# Patient Record
Sex: Male | Born: 1980 | Race: Black or African American | Hispanic: No | Marital: Married | Smoking: Current every day smoker
Health system: Southern US, Community
[De-identification: ages and names within clinical notes are randomized; demographics above are authoritative.]

## PROBLEM LIST (undated history)

## (undated) DIAGNOSIS — E039 Hypothyroidism, unspecified: Secondary | ICD-10-CM

## (undated) DIAGNOSIS — Z8619 Personal history of other infectious and parasitic diseases: Secondary | ICD-10-CM

## (undated) HISTORY — PX: NO PAST SURGERIES: SHX2092

## (undated) HISTORY — DX: Hypothyroidism, unspecified: E03.9

## (undated) HISTORY — DX: Personal history of other infectious and parasitic diseases: Z86.19

---

## 2008-02-22 ENCOUNTER — Emergency Department (HOSPITAL_BASED_OUTPATIENT_CLINIC_OR_DEPARTMENT_OTHER): Admission: EM | Admit: 2008-02-22 | Discharge: 2008-02-22 | Payer: Self-pay | Admitting: Emergency Medicine

## 2011-05-21 ENCOUNTER — Emergency Department (INDEPENDENT_AMBULATORY_CARE_PROVIDER_SITE_OTHER): Payer: PRIVATE HEALTH INSURANCE

## 2011-05-21 ENCOUNTER — Inpatient Hospital Stay (HOSPITAL_COMMUNITY)
Admission: EM | Admit: 2011-05-21 | Discharge: 2011-05-24 | DRG: 645 | Disposition: A | Discharge status: Home / Self Care | Payer: No Typology Code available for payment source | Source: Ambulatory Visit | Attending: Internal Medicine | Admitting: Internal Medicine

## 2011-05-21 ENCOUNTER — Encounter (HOSPITAL_COMMUNITY): Payer: Self-pay | Admitting: *Deleted

## 2011-05-21 ENCOUNTER — Emergency Department (HOSPITAL_COMMUNITY): Payer: No Typology Code available for payment source

## 2011-05-21 ENCOUNTER — Emergency Department (INDEPENDENT_AMBULATORY_CARE_PROVIDER_SITE_OTHER)
Admission: EM | Admit: 2011-05-21 | Discharge: 2011-05-21 | Disposition: A | Discharge status: Home / Self Care | Payer: PRIVATE HEALTH INSURANCE | Source: Home / Self Care | Attending: Emergency Medicine | Admitting: Emergency Medicine

## 2011-05-21 DIAGNOSIS — R509 Fever, unspecified: Secondary | ICD-10-CM

## 2011-05-21 DIAGNOSIS — E0590 Thyrotoxicosis, unspecified without thyrotoxic crisis or storm: Principal | ICD-10-CM | POA: Diagnosis present

## 2011-05-21 DIAGNOSIS — E860 Dehydration: Secondary | ICD-10-CM | POA: Diagnosis present

## 2011-05-21 DIAGNOSIS — IMO0002 Reserved for concepts with insufficient information to code with codable children: Secondary | ICD-10-CM

## 2011-05-21 DIAGNOSIS — D649 Anemia, unspecified: Secondary | ICD-10-CM | POA: Diagnosis present

## 2011-05-21 DIAGNOSIS — R05 Cough: Secondary | ICD-10-CM | POA: Diagnosis present

## 2011-05-21 DIAGNOSIS — R634 Abnormal weight loss: Secondary | ICD-10-CM

## 2011-05-21 DIAGNOSIS — R197 Diarrhea, unspecified: Secondary | ICD-10-CM | POA: Diagnosis present

## 2011-05-21 DIAGNOSIS — R059 Cough, unspecified: Secondary | ICD-10-CM | POA: Diagnosis present

## 2011-05-21 LAB — COMPREHENSIVE METABOLIC PANEL
ALT: 53 U/L (ref 0–53)
AST: 26 U/L (ref 0–37)
Albumin: 3.3 g/dL — ABNORMAL LOW (ref 3.5–5.2)
Alkaline Phosphatase: 104 U/L (ref 39–117)
BUN: 12 mg/dL (ref 6–23)
CO2: 24 mEq/L (ref 19–32)
Calcium: 12.9 mg/dL — ABNORMAL HIGH (ref 8.4–10.5)
Chloride: 100 mEq/L (ref 96–112)
Creatinine, Ser: 0.72 mg/dL (ref 0.50–1.35)
GFR calc Af Amer: >90 mL/min (ref >90)
GFR calc non Af Amer: >90 mL/min (ref >90)
Glucose, Bld: 94 mg/dL (ref 70–99)
Potassium: 3.8 mEq/L (ref 3.5–5.1)
Sodium: 138 mEq/L (ref 135–145)
Total Bilirubin: 0.4 mg/dL (ref 0.3–1.2)
Total Protein: 6.5 g/dL (ref 6.0–8.3)

## 2011-05-21 LAB — URINALYSIS, ROUTINE W REFLEX MICROSCOPIC
Glucose, UA: NEGATIVE mg/dL
Hgb urine dipstick: NEGATIVE
Ketones, ur: NEGATIVE mg/dL
Leukocytes, UA: NEGATIVE
Protein, ur: NEGATIVE mg/dL
Urobilinogen, UA: 1 mg/dL (ref 0.0–1.0)

## 2011-05-21 LAB — DIFFERENTIAL
Basophils Absolute: 0 10*3/uL (ref 0.0–0.1)
Basophils Relative: 0 % (ref 0–1)
Eosinophils Absolute: 0 10*3/uL (ref 0.0–0.7)
Eosinophils Relative: 0 % (ref 0–5)
Lymphs Abs: 1.9 10*3/uL (ref 0.7–4.0)
Neutrophils Relative %: 71 % (ref 43–77)

## 2011-05-21 LAB — CBC
MCH: 25.4 pg — ABNORMAL LOW (ref 26.0–34.0)
MCHC: 33.5 g/dL (ref 30.0–36.0)
Platelets: 205 10*3/uL (ref 150–400)
RBC: 4.56 MIL/uL (ref 4.22–5.81)
RDW: 13.1 % (ref 11.5–15.5)

## 2011-05-21 LAB — POCT URINALYSIS DIP (DEVICE)
Bilirubin Urine: NEGATIVE
Glucose, UA: NEGATIVE mg/dL
Leukocytes, UA: NEGATIVE
Nitrite: NEGATIVE
Urobilinogen, UA: 0.2 mg/dL (ref 0.0–1.0)

## 2011-05-21 LAB — RAPID URINE DRUG SCREEN, HOSP PERFORMED
Amphetamines: NOT DETECTED
Benzodiazepines: NOT DETECTED
Tetrahydrocannabinol: NOT DETECTED

## 2011-05-21 LAB — LIPASE, BLOOD: Lipase: 18 U/L (ref 11–59)

## 2011-05-21 MED ORDER — IOHEXOL 300 MG/ML  SOLN
100.0000 mL | Freq: Once | INTRAMUSCULAR | Status: AC | PRN
  Administered 2011-05-21: 100 mL via INTRAVENOUS

## 2011-05-21 MED ORDER — IOHEXOL 300 MG/ML  SOLN
20.0000 mL | INTRAMUSCULAR | Status: AC
  Administered 2011-05-21: 20 mL via ORAL

## 2011-05-21 MED ORDER — SODIUM CHLORIDE 0.9 % IV BOLUS (SEPSIS)
1000.0000 mL | Freq: Once | INTRAVENOUS | Status: AC
  Administered 2011-05-21: 1000 mL via INTRAVENOUS

## 2011-05-21 NOTE — ED Provider Notes (Signed)
History     CSN: [id number]  Arrival date & time 05/21/11  1603   First MD Initiated Contact with Patient 05/21/11 1641      Chief Complaint  Patient presents with  . Abdominal Pain    diarrhea    (Consider location/radiation/quality/duration/timing/severity/associated sxs/prior treatment) HPI Patient has emergency department with 2 months worth of diarrhea with about a 60 pound weight loss and also having decreased appetite, nausea, vomiting, and weakness.  Patient states it is not have any chest pain or shortness of breath, back pain, visual changes, headache, cough, or URI symptoms.  Patient states that he has noticed that when he coughed a few times in the last few days, he noted some blood streaks.  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  History reviewed. No pertinent past medical history.  History reviewed. No pertinent past surgical history.  No family history on file.  History  Substance Use Topics  . Smoking status: Current Some Day Smoker  . Smokeless tobacco: Not on file  . Alcohol Use: No      Review of Systems All pertinent positives and negatives reviewed in the history of present illness  Allergies  Review of patient's allergies indicates no known allergies.  Home Medications   Current Outpatient Rx  Name Route Sig Dispense Refill  . VITAMIN C PO Oral Take 1 tablet by mouth daily.     . IBUPROFEN 200 MG PO TABS Oral Take 400 mg by mouth every 6 (six) hours as needed. For pain    . IRON PO Oral Take 1 tablet by mouth daily.     . MULTIVITAMIN PO Oral Take 1 tablet by mouth daily.       BP 148/72  Pulse 106  Temp(Src) 99 F (37.2 C) (Oral)  Resp 24  SpO2 100%  Physical Exam  Constitutional: He is oriented to person, place, and time. He appears well-developed and well-nourished. No distress.  HENT:  Head: Normocephalic and atraumatic.  Mouth/Throat: Oropharynx is clear and  moist. No oropharyngeal exudate.  Eyes: Conjunctivae and EOM are normal. Pupils are equal, round, and reactive to light.  Neck: Normal range of motion. Neck supple.  Cardiovascular: Regular rhythm.  Tachycardia present.  Exam reveals no gallop and no friction rub.   No murmur heard. Pulmonary/Chest: Effort normal and breath sounds normal. No respiratory distress. He has no wheezes. He has no rales.  Abdominal: Soft. Bowel sounds are normal. He exhibits no distension. There is no tenderness. There is no rebound and no guarding.  Lymphadenopathy:    He has cervical adenopathy.  Neurological: He is alert and oriented to person, place, and time.  Skin: Skin is warm and dry. No rash noted. No erythema.    ED Course  Procedures (including critical care time)  Labs Reviewed  COMPREHENSIVE METABOLIC PANEL - Abnormal; Notable for the following:    Calcium 12.9 (*)    Albumin 3.3 (*)    All other components within normal limits  URINALYSIS, ROUTINE W REFLEX MICROSCOPIC - Abnormal; Notable for the following:    APPearance HAZY (*)    All other components within normal limits  LIPASE, BLOOD  URINE RAPID DRUG SCREEN (HOSP PERFORMED)  T4, FREE  TSH  PARATHYROID HORMONE, INTACT (NO CA)  CALCITONIN  HIV-1 DNA QUALITATIVE BY PCR, BLOOD  CORTISOL  VITAMIN D 1,25 DIHYDROXY  VITAMIN D 25 HYDROXY  SEDIMENTATION RATE  PROTEIN ELECTROPHORESIS, SERUM  PTH-RELATED PEPTIDE   Dg Chest 2 View  05/21/2011  *RADIOLOGY REPORT*  Clinical Data: Cough, congestion.  CHEST - 2 VIEW  Comparison: None.  Findings: Heart and mediastinal contours are within normal limits. No focal opacities or effusions.  No acute bony abnormality.  IMPRESSION: No active cardiopulmonary disease.  Original Report Authenticated By: Cyndie Chime, M.D.   Ct Abdomen Pelvis W Contrast  05/21/2011  *RADIOLOGY REPORT*  Clinical Data: Abdominal pain.  Weight loss.  Nausea and vomiting. Diarrhea.  CT ABDOMEN AND PELVIS WITH CONTRAST   Technique:  Multidetector CT imaging of the abdomen and pelvis was performed following the standard protocol during bolus administration of intravenous contrast.  Contrast: OMNIPAQUE IOHEXOL 300 MG/ML  SOLN  Comparison: None.  Findings: Contrast opacity is poor for some reason.  The technologist states that the scan proceeded normally.  Lung bases are clear.  No pleural or pericardial fluid.  The liver has a normal appearance without focal lesions or biliary ductal dilatation. The gallbladder appears collapsed but unremarkable.  The spleen is normal.  The pancreas is normal.  The adrenal glands are normal.  The kidneys are normal.  The aorta and IVC are normal.  No retroperitoneal mass or adenopathy.  No free intraperitoneal fluid or air.  No bowel pathology is seen.  The appendix is normal.  Bladder, prostate gland and seminal vesicles are unremarkable.  No significant bony finding.  IMPRESSION: Poor contrast opacity, reason unknown.  No pathologic finding.  Original Report Authenticated By: Thomasenia Sales, M.D.   Patient was noted to have a high calcium level and with his other symptoms.  Will be admitted to the hospital for further evaluation of this.  I spoke with the [facility] and they will be done to evaluate the patient for admission.   MDM  MDM Reviewed: vitals and nursing note Interpretation: labs, ECG and CT scan Consults: admitting MD       Date: 05/21/2011  Rate: 106  Rhythm: sinus tachycardia  QRS Axis: normal  Intervals: normal  ST/T Wave abnormalities: normal  Conduction Disutrbances:none  Narrative Interpretation:   Old EKG Reviewed: unchanged        Carlyle Dolly, PA-C 05/21/11 2310

## 2011-05-21 NOTE — ED Provider Notes (Signed)
Patient here with his wife and brother. He relates about 2 months ago he started having loss of appetite, diarrhea, with about 60 pound weight loss in that two-month period. He also has some abdominal discomfort. Wife reports he is getting so weak he is having difficulty getting up out of bed or out of a chair. She states as soon as he eats he starts having diarrhea.  Patient is a tall thin male who looks like he feels bad. He has no obvious thyromegaly. He is in no respiratory distress.  Review of patient's labs noted very significant hypercalcemia. Patient started on IV fluids. He is going to be admitted  for treatment of his hypercalcemia. Appropriate further laboratory tests were sent such as thyroid tests and parathyroid hormone.  Medical screening examination/treatment/procedure(s) were conducted as a shared visit with non-physician practitioner(s) and myself.  I personally evaluated the patient during the encounter Devoria Albe, MD, Franz Dell, MD 05/21/11 [phone]

## 2011-05-21 NOTE — ED Notes (Signed)
PT reports diarrhea every other other day and a weight loss of 60lbs per Pt in one month.  PT's PCP is in [place].  PT reports a Poor appetite.  PT was seen at [facility] prior to this visit.

## 2011-05-21 NOTE — ED Notes (Signed)
PA student saw patient prior to nurse.  Pt c/o generalized abd pain intermittent with vomiting for approx 2 mos.  States he's lost 60 lbs in past 2 mos without trying.  Pt's wife states he works 3rd shift, goes to school in the am and then has basketball practice every day.  Not getting a lot of sleep.  States some days he's fine and can eat, but doesn't eat a lot because it may cause him to throw up or start hurting.

## 2011-05-21 NOTE — H&P (Signed)
PCP:  No local. Dr Renard Matter in [facility]   Chief Complaint:  fever  HPI: Greg Harrison is a 31 y.o. male   has no past medical history on file.   Presented with  2 month history of intermittent fever, cough, runny nose some time with bloody mucous, loosing weight 60 lb over 2 months. Have had diarrhea for 2 weeks as well. Reports having a HIV testing done long time ago. He continued to have fevers and came in to Urgent care today from there he was sent to ER. He is fairly healthy male prior to his illness who exercises regularly. He started to take iron supplements to build up his muscle not because he was anemic.    Review of Systems:    Pertinent positives include: night sweats, Fevers, chills, fatigue, weight loss diarrhea, nausea, vomiting, hips and Knee pains, no myalgias  Constitutional:  No weight loss,  HEENT:  No headaches, Difficulty swallowing,Tooth/dental problems,Sore throat,  No sneezing, itching, ear ache, nasal congestion, post nasal drip,  Cardio-vascular:  No chest pain, Orthopnea, PND, anasarca, dizziness, palpitations.no Bilateral lower extremity swelling  GI:  No heartburn, indigestion, abdominal pain,  change in bowel habits, loss of appetite, melena, blood in stool, hematoemesis Resp:  no shortness of breath at rest. No dyspnea on exertion, No excess mucus, no productive cough, No non-productive cough, No coughing up of blood.No change in color of mucus.No wheezing. Skin:  no rash or lesions. No jaundice GU:  no dysuria, change in color of urine, no urgency or frequency. No straining to urinate.  No flank pain.  Musculoskeletal:  No joint pain or no joint swelling. No decreased range of motion. No back pain.  Psych:  No change in mood or affect. No depression or anxiety. No memory loss.  Neuro: no localizing neurological complaints, no tingling, no weakness, no double vision, no gait abnormality, no slurred speech, no confusion  Otherwise ROS are negative  except for above, 10 systems were reviewed  Past Medical History: History reviewed. No pertinent past medical history. History reviewed. No pertinent past surgical history.   Medications: Prior to Admission medications   Medication Sig Start Date End Date Taking? Authorizing Provider  Ascorbic Acid (VITAMIN C PO) Take 1 tablet by mouth daily.    Yes Historical Provider, MD  ibuprofen (ADVIL,MOTRIN) 200 MG tablet Take 400 mg by mouth every 6 (six) hours as needed. For pain   Yes Historical Provider, MD  IRON PO Take 1 tablet by mouth daily.    Yes Historical Provider, MD  Multiple Vitamins-Minerals (MULTIVITAMIN PO) Take 1 tablet by mouth daily.    Yes Historical Provider, MD    Allergies:  No Known Allergies  Social History:  Ambulatory  independently  Lives at  home   reports that he has quit smoking. He does not have any smokeless tobacco history on file. He reports that he does not drink alcohol or use illicit drugs.   Family History: family history includes Heart disease in his father and Hypertension in his other.    Physical Exam: Patient Vitals for the past 24 hrs:  BP Temp Temp src Pulse Resp SpO2  05/21/11 2145 148/72 mmHg 99 F (37.2 C) Oral - 24  100 %  05/21/11 1927 138/74 mmHg 99.7 F (37.6 C) Oral 106  16  98 %  05/21/11 1610 125/67 mmHg 99.3 F (37.4 C) - 113  18  97 %    1. General:  in No Acute distress 2.  Psychological: Alert and Oriented 3. Head/ENT:    Dry Mucous Membranes                          Head Non traumatic, neck supple                          Normal Dentition no ulcerations or plaques present in the mouth 4. SKIN:  decreased Skin turgor,  Skin clean Dry and intact there is numerous lesions which could be consistent with molluscum contagiosum 5. Heart: Regular rate and rhythm no Murmur, Rub or gallop 6. Lungs: Clear to auscultation bilaterally, no wheezes or crackles   7. Abdomen: Soft, non-tender, Non distended 8. Lower extremities: no  clubbing, cyanosis, or edema 9. Neurologically Grossly intact, moving all 4 extremities equally 10. MSK: Normal range of motion  body mass index is unknown because there is no height or weight on file.   Labs on Admission:   [facility] 05/21/11 1646  NA 138  K 3.8  CL 100  CO2 24  GLUCOSE 94  BUN 12  CREATININE 0.72  CALCIUM 12.9*  MG --  PHOS --    Basename 05/21/11 1646  AST 26  ALT 53  ALKPHOS 104  BILITOT 0.4  PROT 6.5  ALBUMIN 3.3*    Basename 05/21/11 1646  LIPASE 18  AMYLASE --    Basename 05/21/11 1439  WBC 10.6*  NEUTROABS 7.5  HGB 11.6*  HCT 34.6*  MCV 75.9*  PLT 205   No results found for this basename: CKTOTAL:3,CKMB:3,CKMBINDEX:3,TROPONINI:3 in the last 72 hours No results found for this basename: TSH,T4TOTAL,FREET3,T3FREE,THYROIDAB in the last 72 hours No results found for this basename: VITAMINB12:2,FOLATE:2,FERRITIN:2,TIBC:2,IRON:2,RETICCTPCT:2 in the last 72 hours No results found for this basename: HGBA1C    CrCl is unknown because there is no height on file for the current visit. ABG No results found for this basename: phart, pco2, po2, hco3, tco2, acidbasedef, o2sat     No results found for this basename: DDIMER     Other results:  I have pearsonaly reviewed this: ECG REPORT  Rate: 106  Rhythm: Sinus tachycardia ST&T Change: Nonspecific changes QTC 425  UA no evidence of infection Sedimentation rate 15  Cultures: No results found for this basename: sdes, specrequest, cult, reptstatus    Pending   Radiological Exams on Admission: Dg Chest 2 View  05/21/2011  *RADIOLOGY REPORT*  Clinical Data: Cough, congestion.  CHEST - 2 VIEW  Comparison: None.  Findings: Heart and mediastinal contours are within normal limits. No focal opacities or effusions.  No acute bony abnormality.  IMPRESSION: No active cardiopulmonary disease.  Original Report Authenticated By: Cyndie Chime, M.D.   Ct Abdomen Pelvis W Contrast  05/21/2011   *RADIOLOGY REPORT*  Clinical Data: Abdominal pain.  Weight loss.  Nausea and vomiting. Diarrhea.  CT ABDOMEN AND PELVIS WITH CONTRAST  Technique:  Multidetector CT imaging of the abdomen and pelvis was performed following the standard protocol during bolus administration of intravenous contrast.  Contrast: OMNIPAQUE IOHEXOL 300 MG/ML  SOLN  Comparison: None.  Findings: Contrast opacity is poor for some reason.  The technologist states that the scan proceeded normally.  Lung bases are clear.  No pleural or pericardial fluid.  The liver has a normal appearance without focal lesions or biliary ductal dilatation. The gallbladder appears collapsed but unremarkable.  The spleen is normal.  The pancreas is normal.  The adrenal  glands are normal.  The kidneys are normal.  The aorta and IVC are normal.  No retroperitoneal mass or adenopathy.  No free intraperitoneal fluid or air.  No bowel pathology is seen.  The appendix is normal.  Bladder, prostate gland and seminal vesicles are unremarkable.  No significant bony finding.  IMPRESSION: Poor contrast opacity, reason unknown.  No pathologic finding.  Original Report Authenticated By: Thomasenia Sales, M.D.    Assessment/Plan  31 year old male presenting with weight loss fevers cough nausea vomiting and hyperglycemia  Present on Admission:  .Hypercalcemia - etiology is not quite clear at this point will admit administer IV fluids aggressively and will follow closely. Will check PTH, PTHrp, TSH vitamin D levels cortisol levels HIV serologies. SPEP and UPEP despite his young age. Negative chest x-ray makes pulmonary disease felt less likely. There is no evidence of adrenal involvement.  .Diarrhea - workup in etiology Will obtain stool cultures fecal lactoferrin  .Abnormal loss of weight and fevers- we'll evaluate for malignancy versus HIV vinfection obtain blood cultures  .Anemia - will check an anemia panel  .Cough - chest x-ray is unremarkable his cough is  nonproductive. I have excised base with e-link and spoke with Dr. Sherene Sires this morning who feels that getting negative chest x-ray he does not require any airborne precautions despite his symptoms. Given that he recently had CT scan with contrast of his abdomen and pelvis will hold off on a CT scan of his chest   Prophylaxis: SCD   CODE STATUS: Full code  I have spent a total of 70 min on this admition  Ithiel Liebler 05/21/2011, 11:24 PM

## 2011-05-21 NOTE — ED Notes (Signed)
pa at bedside. 

## 2011-05-21 NOTE — ED Provider Notes (Signed)
History     CSN: [id number]  Arrival date & time 05/21/11  1323   First MD Initiated Contact with Patient 05/21/11 1340      Chief Complaint  Patient presents with  . Weight Loss  . Abdominal Pain    (Consider location/radiation/quality/duration/timing/severity/associated sxs/prior treatment) Patient is a 31 y.o. male presenting with abdominal pain.  Abdominal Pain The primary symptoms of the illness include abdominal pain, fever, fatigue, nausea, vomiting and diarrhea. The primary symptoms of the illness do not include hematemesis or dysuria. Primary symptoms comment: tactile fever Episode onset: over the past 1-2 months. The onset of the illness was gradual. The problem has been rapidly worsening.  The fever has been unchanged since its onset.  The diarrhea occurs 2 to 4 times per day. Risk factors: denies any new medications, travel.  Associated with: daily, denies recent travel outside the [place] Change in bowel habit: mostly diarrhea, occasional formed stool over the past 2  months  Additional symptoms associated with the illness include chills and diaphoresis. Symptoms associated with the illness do not include heartburn, constipation, urgency or hematuria. Associated medical issues comments: denies PMH.    History reviewed. No pertinent past medical history.  History reviewed. No pertinent past surgical history.  History reviewed. No pertinent family history.  History  Substance Use Topics  . Smoking status: Current Some Day Smoker  . Smokeless tobacco: Not on file  . Alcohol Use: No      Review of Systems  Constitutional: Positive for fever, chills, diaphoresis, activity change, appetite change and fatigue.       Reports progressively "weak, lethargic", unable to eat solid food in approximately 1-2 months  HENT: Negative for neck stiffness.   Respiratory: Positive for cough. Negative for chest tightness.   Cardiovascular: Negative for chest pain, palpitations and leg  swelling.  Gastrointestinal: Positive for nausea, vomiting, abdominal pain and diarrhea. Negative for heartburn, constipation and hematemesis.  Genitourinary: Negative for dysuria, urgency and hematuria.  Musculoskeletal:       Generalized "weakness"  Neurological: Positive for dizziness, weakness, light-headedness and headaches. Negative for numbness.    Allergies  Review of patient's allergies indicates no known allergies.  Home Medications   Current Outpatient Rx  Name Route Sig Dispense Refill  . VITAMIN C PO Oral Take by mouth.    . IBUPROFEN PO Oral Take by mouth.    . IRON PO Oral Take by mouth.    . MULTIVITAMIN PO Oral Take by mouth 1 day or 1 dose.      BP 144/61  Pulse 108  Temp(Src) 100.5 F (38.1 C) (Oral)  Resp 18  SpO2 100%  Physical Exam  Constitutional: He is oriented to person, place, and time. He appears well-developed and well-nourished. No distress.  Cardiovascular: Normal rate.   Pulmonary/Chest: No respiratory distress. He has no wheezes.  Abdominal: He exhibits no distension. There is tenderness. There is no rebound and no guarding.  Musculoskeletal: He exhibits no edema and no tenderness.  Lymphadenopathy:    He has cervical adenopathy.  Neurological: He is alert and oriented to person, place, and time.  Skin: Skin is warm. No rash noted.  Psychiatric: He has a normal mood and affect.       Cooperative     ED Course  Procedures (including critical care time)  Labs Reviewed  GLUCOSE, CAPILLARY - Abnormal; Notable for the following:    Glucose-Capillary 107 (*)    All other components within normal limits  CBC - Abnormal; Notable for the following:    WBC 10.6 (*)    Hemoglobin 11.6 (*)    HCT 34.6 (*)    MCV 75.9 (*)    MCH 25.4 (*)    All other components within normal limits  DIFFERENTIAL - Abnormal; Notable for the following:    Monocytes Absolute 1.2 (*)    All other components within normal limits  POCT URINALYSIS DIP (DEVICE)    Dg Chest 2 View  05/21/2011  *RADIOLOGY REPORT*  Clinical Data: Cough, congestion.  CHEST - 2 VIEW  Comparison: None.  Findings: Heart and mediastinal contours are within normal limits. No focal opacities or effusions.  No acute bony abnormality.  IMPRESSION: No active cardiopulmonary disease.  Original Report Authenticated By: Cyndie Chime, M.D.     1. Fever   2. Weight loss     ver 2 months) ,persistant fever, dizziness, lethargy, constant headache, diarrhea, vomiting  Not improving over the past 2 months, has been able to work, Public librarian * wnl, CBC, CXR done, urinalysis pending, will send to ER by shuttle for further evaluation, patient and wife agreed to tx plan       Lolly Mustache, Student-NP 05/21/11 1553  Derrick Ravel [place], Student-NP 05/21/11 1556

## 2011-05-22 ENCOUNTER — Inpatient Hospital Stay (HOSPITAL_COMMUNITY): Payer: No Typology Code available for payment source

## 2011-05-22 LAB — CBC
MCHC: 33.6 g/dL (ref 30.0–36.0)
RDW: 13.1 % (ref 11.5–15.5)

## 2011-05-22 LAB — CORTISOL: Cortisol, Plasma: 16.6 ug/dL

## 2011-05-22 LAB — FOLATE: Folate: >20 ng/mL

## 2011-05-22 LAB — POCT I-STAT, CHEM 8
HCT: 36 % — ABNORMAL LOW (ref 39.0–52.0)
Hemoglobin: 12.2 g/dL — ABNORMAL LOW (ref 13.0–17.0)
Potassium: 3.8 mEq/L (ref 3.5–5.1)
Sodium: 139 mEq/L (ref 135–145)

## 2011-05-22 LAB — HEPATIC FUNCTION PANEL
Albumin: 3.1 g/dL — ABNORMAL LOW (ref 3.5–5.2)
Alkaline Phosphatase: 95 U/L (ref 39–117)
Total Protein: 5.8 g/dL — ABNORMAL LOW (ref 6.0–8.3)

## 2011-05-22 LAB — COMPREHENSIVE METABOLIC PANEL
ALT: 51 U/L (ref 0–53)
Alkaline Phosphatase: 101 U/L (ref 39–117)
CO2: 27 mEq/L (ref 19–32)
Chloride: 104 mEq/L (ref 96–112)
GFR calc Af Amer: >90 mL/min (ref >90)
GFR calc non Af Amer: >90 mL/min (ref >90)
Glucose, Bld: 96 mg/dL (ref 70–99)
Potassium: 3.7 mEq/L (ref 3.5–5.1)
Sodium: 139 mEq/L (ref 135–145)

## 2011-05-22 LAB — DIFFERENTIAL
Basophils Relative: 0 % (ref 0–1)
Lymphs Abs: 2.4 10*3/uL (ref 0.7–4.0)
Monocytes Absolute: 1.6 10*3/uL — ABNORMAL HIGH (ref 0.1–1.0)
Monocytes Relative: 17 % — ABNORMAL HIGH (ref 3–12)
Neutro Abs: 5.6 10*3/uL (ref 1.7–7.7)

## 2011-05-22 LAB — TSH: TSH: <0.008 u[IU]/mL — ABNORMAL LOW (ref 0.350–4.500)

## 2011-05-22 LAB — PARATHYROID HORMONE, INTACT (NO CA): PTH: <2.5 pg/mL — ABNORMAL LOW (ref 14.0–72.0)

## 2011-05-22 LAB — IRON AND TIBC
Iron: 22 ug/dL — ABNORMAL LOW (ref 42–135)
Saturation Ratios: 8 % — ABNORMAL LOW (ref 20–55)
UIBC: 238 ug/dL (ref 125–400)

## 2011-05-22 LAB — T4, FREE: Free T4: 11.43 ng/dL — ABNORMAL HIGH (ref 0.80–1.80)

## 2011-05-22 LAB — VITAMIN D 25 HYDROXY (VIT D DEFICIENCY, FRACTURES): Vit D, 25-Hydroxy: 33 ng/mL (ref 30–89)

## 2011-05-22 MED ORDER — SODIUM CHLORIDE 0.9 % IV SOLN
INTRAVENOUS | Status: DC
  Administered 2011-05-22 – 2011-05-24 (×5): via INTRAVENOUS

## 2011-05-22 MED ORDER — LOPERAMIDE HCL 2 MG PO CAPS: 4.0000 mg | ORAL_CAPSULE | ORAL | Status: DC | PRN

## 2011-05-22 MED ORDER — ACETAMINOPHEN 325 MG PO TABS: 650.0000 mg | ORAL_TABLET | Freq: Four times a day (QID) | ORAL | Status: DC | PRN

## 2011-05-22 MED ORDER — ONDANSETRON HCL 4 MG/2ML IJ SOLN: 4.0000 mg | Freq: Four times a day (QID) | INTRAMUSCULAR | Status: DC | PRN

## 2011-05-22 MED ORDER — ALBUTEROL SULFATE (5 MG/ML) 0.5% IN NEBU: 2.5000 mg | INHALATION_SOLUTION | RESPIRATORY_TRACT | Status: DC | PRN

## 2011-05-22 MED ORDER — ACETAMINOPHEN 650 MG RE SUPP: 650.0000 mg | Freq: Four times a day (QID) | RECTAL | Status: DC | PRN

## 2011-05-22 MED ORDER — ATENOLOL 25 MG PO TABS
25.0000 mg | ORAL_TABLET | Freq: Every day | ORAL | Status: DC
  Administered 2011-05-22 – 2011-05-23 (×2): 25 mg via ORAL
  Filled 2011-05-22 (×2): qty 1

## 2011-05-22 MED ORDER — SODIUM CHLORIDE 0.9 % IJ SOLN
3.0000 mL | Freq: Two times a day (BID) | INTRAMUSCULAR | Status: DC
  Administered 2011-05-24: 3 mL via INTRAVENOUS

## 2011-05-22 MED ORDER — ONDANSETRON HCL 4 MG PO TABS: 4.0000 mg | ORAL_TABLET | Freq: Four times a day (QID) | ORAL | Status: DC | PRN

## 2011-05-22 MED ORDER — HYDROCODONE-ACETAMINOPHEN 5-325 MG PO TABS: 1.0000 | ORAL_TABLET | ORAL | Status: DC | PRN

## 2011-05-22 MED ORDER — SODIUM CHLORIDE 0.9 % IV SOLN
INTRAVENOUS | Status: AC
  Administered 2011-05-22: 01:00:00 via INTRAVENOUS

## 2011-05-22 MED ORDER — METHIMAZOLE 10 MG PO TABS
10.0000 mg | ORAL_TABLET | Freq: Every day | ORAL | Status: DC
  Administered 2011-05-22 – 2011-05-23 (×2): 10 mg via ORAL
  Filled 2011-05-22 (×2): qty 1

## 2011-05-22 MED ORDER — ALUM & MAG HYDROXIDE-SIMETH 200-200-20 MG/5ML PO SUSP: 30.0000 mL | Freq: Four times a day (QID) | ORAL | Status: DC | PRN

## 2011-05-22 MED ORDER — GUAIFENESIN-DM 100-10 MG/5ML PO SYRP: 5.0000 mL | ORAL_SOLUTION | ORAL | Status: DC | PRN

## 2011-05-22 NOTE — ED Notes (Signed)
Per Dr. Adela Glimpse - hold on transferring pt to floor d/t pt may need Negative pressure room. Dr Adela Glimpse to call this Clinical research associate w/ update shortly. Charge, RN made aware.

## 2011-05-22 NOTE — ED Provider Notes (Signed)
Medical screening examination/treatment/procedure(s) were performed by non-physician practitioner and as supervising physician I was immediately available for consultation/collaboration.Examined and evaluated the patient with NP student  Raynald Blend, MD 05/22/11 [phone]

## 2011-05-22 NOTE — Progress Notes (Signed)
INITIAL ADULT NUTRITION ASSESSMENT Date: 05/22/2011   Time: 11:59 AM  Reason for Assessment: Nutrition Risk Report  ASSESSMENT: Male 31 y.o.  Dx: Hypercalcemia, fever, diarrhea  Hx: History reviewed. No pertinent past medical history.  Related Meds:     . iohexol  20 mL Oral Q1 Hr x 2  . sodium chloride  1,000 mL Intravenous Once  . sodium chloride  3 mL Intravenous Q12H    Ht: 6\' 6"  (198.1 cm)  Wt: 197 lb 4.8 oz (89.495 kg)  Ideal Wt: 97.2 kg % Ideal Wt: 92%  Usual Wt: 245-250 lb % Usual Wt: 78-80%  Body mass index is 22.80 kg/(m^2).  Food/Nutrition Related Hx: unintentional weight loss > 10 lbs within the past month per admission nutrition screen  Labs:  CMP     Component Value Date/Time   NA 139 05/22/2011 0510   K 3.7 05/22/2011 0510   CL 104 05/22/2011 0510   CO2 27 05/22/2011 0510   GLUCOSE 96 05/22/2011 0510   BUN 12 05/22/2011 0510   CREATININE 0.65 05/22/2011 0510   CALCIUM 12.6* 05/22/2011 0510   PROT 5.9* 05/22/2011 0510   ALBUMIN 3.1* 05/22/2011 0510   AST 32 05/22/2011 0510   ALT 51 05/22/2011 0510   ALKPHOS 101 05/22/2011 0510   BILITOT 0.4 05/22/2011 0510   GFRNONAA >90 05/22/2011 0510   GFRAA >90 05/22/2011 0510     Intake/Output Summary (Last 24 hours) at 05/22/11 1200 Last data filed at 05/22/11 1000  Gross per 24 hour  Intake 1677.5 ml  Output      0 ml  Net 1677.5 ml    CBG (last 3)   Basename 05/21/11 1434  GLUCAP 107*    Diet Order: Parke Simmers  Supplements/Tube Feeding: N/A  IVF:    sodium chloride Last Rate: 150 mL/hr at 05/22/11 0113    Estimated Nutritional Needs:   Kcal: 2,200-2,400 Protein: 110-120 gm Fluid: 2.2-2.4 L  RD obtained nutrition hx from pt -- states his appetite is fair; 90% intake with breakfast this AM; reports an approximate 50-60 lb weight loss x 2 months; was having diarrhea and vomiting prior to hospitalization; reports he was consuming his regular meal options but in smaller quantities -- RD interprets this  as pt meeting < 75% of estimated energy requirements; pt meets criteria for severe malnutrition in the context of chronic illness given 20% weight loss < 3 months & suboptimal energy intake for > 1 month; noted evaluation for malignancy vs HIV infection; declining addition of nutrition supplements at this time  NUTRITION DIAGNOSIS: -Predicted suboptimal energy intake (NI-1.6).  Status: Ongoing  RELATED TO: decreased appetite, diarrhea  AS EVIDENCE BY: pt report  MONITORING/EVALUATION(Goals): Goal: meet >90% of estimated nutrition needs to prevent further weight loss Monitor: PO intake, desire for supplements, weight, labs, I/O's  EDUCATION NEEDS: -No education needs identified at this time  INTERVENTION:  No nutrition intervention at this time -- pt declined  RD to follow for nutrition care plan  Dietitian #: [phone]  DOCUMENTATION CODES Per approved criteria  -Severe malnutrition in the context of chronic illness    Alger Memos 05/22/2011, 11:59 AM

## 2011-05-22 NOTE — Progress Notes (Signed)
Utilization Review Completed.Greg Harrison T4/12/2011   

## 2011-05-22 NOTE — Progress Notes (Signed)
Greg Harrison is a 31 y.o. male admitted with 60lb weight loss over 2 month period, diarrhea and easy fatigability. He is hypercalcemic. I have reviewed his chart, seen and examined him at bed side in the presence of his wife. His labs suggest primary hyperthyroidism.  SUBJECTIVE Feels slightly better.   1. Hypercalcemia     History reviewed. No pertinent past medical history. Current Facility-Administered Medications  Medication Dose Route Frequency Provider Last Rate Last Dose  . 0.9 %  sodium chloride infusion   Intravenous Continuous Therisa Doyne, MD 150 mL/hr at 05/22/11 0113    . 0.9 %  sodium chloride infusion   Intravenous Continuous Thorn Demas, MD      . acetaminophen (TYLENOL) tablet 650 mg  650 mg Oral Q6H PRN Therisa Doyne, MD       Or  . acetaminophen (TYLENOL) suppository 650 mg  650 mg Rectal Q6H PRN Therisa Doyne, MD      . albuterol (PROVENTIL) (5 MG/ML) 0.5% nebulizer solution 2.5 mg  2.5 mg Nebulization Q2H PRN Therisa Doyne, MD      . alum & mag hydroxide-simeth (MAALOX/MYLANTA) 200-200-20 MG/5ML suspension 30 mL  30 mL Oral Q6H PRN Therisa Doyne, MD      . atenolol (TENORMIN) tablet 25 mg  25 mg Oral Daily Lillyan Hitson, MD      . guaiFENesin-dextromethorphan (ROBITUSSIN DM) 100-10 MG/5ML syrup 5 mL  5 mL Oral Q4H PRN Therisa Doyne, MD      . HYDROcodone-acetaminophen (NORCO) 5-325 MG per tablet 1-2 tablet  1-2 tablet Oral Q4H PRN Therisa Doyne, MD      . iohexol (OMNIPAQUE) 300 MG/ML solution 100 mL  100 mL Intravenous Once PRN Therisa Doyne, MD   100 mL at 05/21/11 2030  . iohexol (OMNIPAQUE) 300 MG/ML solution 20 mL  20 mL Oral Q1 Hr x 2 Therisa Doyne, MD   20 mL at 05/21/11 1743  . methimazole (TAPAZOLE) tablet 10 mg  10 mg Oral Daily Coen Miyasato, MD      . ondansetron (ZOFRAN) tablet 4 mg  4 mg Oral Q6H PRN Therisa Doyne, MD       Or  . ondansetron (ZOFRAN) injection 4 mg  4 mg Intravenous Q6H PRN Therisa Doyne, MD      . sodium chloride 0.9 % bolus 1,000 mL  1,000 mL Intravenous Once Jamesetta Orleans Lawyer, PA-C   1,000 mL at 05/21/11 1659  . sodium chloride 0.9 % injection 3 mL  3 mL Intravenous Q12H Therisa Doyne, MD       No Known Allergies Principal Problem:  *Hypercalcemia Active Problems:  Diarrhea  Abnormal loss of weight  Anemia  Cough   Vital signs in last 24 hours: Temp:  [98.4 F (36.9 C)-100.5 F (38.1 C)] 98.4 F (36.9 C) (04/11 0400) Pulse Rate:  [102-126] 126  (04/11 0403) Resp:  [16-29] 19  (04/11 0400) BP: (118-154)/(61-85) 118/76 mmHg (04/11 0403) SpO2:  [97 %-100 %] 99 % (04/11 0400) Weight:  [89.495 kg (197 lb 4.8 oz)-97.523 kg (215 lb)] 89.495 kg (197 lb 4.8 oz) (04/11 0400) Weight change:  Last BM Date: 05/22/11  Intake/Output from previous day: 04/10 0701 - 04/11 0700 In: 867.5 [I.V.:867.5] Out: -  Intake/Output this shift: Total I/O In: 810 [P.O.:360; I.V.:450] Out: -   Lab Results:  Basename 05/22/11 0510 05/21/11 1444 05/21/11 1439  WBC 9.7 -- 10.6*  HGB 11.2* 12.2* --  HCT 33.3* 36.0* --  PLT 181 -- 205  BMET  Basename 05/22/11 0510 05/21/11 1646  NA 139 138  K 3.7 3.8  CL 104 100  CO2 27 24  GLUCOSE 96 94  BUN 12 12  CREATININE 0.65 0.72  CALCIUM 12.6* 12.9*    Studies/Results: Dg Chest 2 View  05/21/2011  *RADIOLOGY REPORT*  Clinical Data: Cough, congestion.  CHEST - 2 VIEW  Comparison: None.  Findings: Heart and mediastinal contours are within normal limits. No focal opacities or effusions.  No acute bony abnormality.  IMPRESSION: No active cardiopulmonary disease.  Original Report Authenticated By: Cyndie Chime, M.D.   Ct Abdomen Pelvis W Contrast  05/21/2011  *RADIOLOGY REPORT*  Clinical Data: Abdominal pain.  Weight loss.  Nausea and vomiting. Diarrhea.  CT ABDOMEN AND PELVIS WITH CONTRAST  Technique:  Multidetector CT imaging of the abdomen and pelvis was performed following the standard protocol during bolus  administration of intravenous contrast.  Contrast: OMNIPAQUE IOHEXOL 300 MG/ML  SOLN  Comparison: None.  Findings: Contrast opacity is poor for some reason.  The technologist states that the scan proceeded normally.  Lung bases are clear.  No pleural or pericardial fluid.  The liver has a normal appearance without focal lesions or biliary ductal dilatation. The gallbladder appears collapsed but unremarkable.  The spleen is normal.  The pancreas is normal.  The adrenal glands are normal.  The kidneys are normal.  The aorta and IVC are normal.  No retroperitoneal mass or adenopathy.  No free intraperitoneal fluid or air.  No bowel pathology is seen.  The appendix is normal.  Bladder, prostate gland and seminal vesicles are unremarkable.  No significant bony finding.  IMPRESSION: Poor contrast opacity, reason unknown.  No pathologic finding.  Original Report Authenticated By: Thomasenia Sales, M.D.    Medications: I have reviewed the patient's current medications.   Physical exam GENERAL- alert HEAD- normal atraumatic, no neck masses, normal thyroid, no jvd RESPIRATORY- appears well, vitals normal, no respiratory distress, acyanotic, normal RR, ear and throat exam is normal, neck free of mass or lymphadenopathy, chest clear, no wheezing, crepitations, rhonchi, normal symmetric air entry CVS- regular rate and rhythm, S1, S2 normal, no murmur, click, rub or gallop ABDOMEN- abdomen is soft without significant tenderness, masses, organomegaly or guarding NEURO- Grossly normal EXTREMITIES- extremities normal, atraumatic, no cyanosis or edema  Plan  * Primary hyperthyroidism- ?cause. Fever and some soreness in throat pointing to ?thyroiditis. Will obtain thyroid ultrasound to check for nodules, Thyroid uptake scan, thyroid antibodies. Start atenolol/methimazole. Will need endocrinology follow up outpatient.  *Hypercalcemia- likely due to hyperthyroidism. Continue iVF. * Diarrhea/ Abnormal loss of  weight/ Anemia- related to hyperthyroidism?- monitor. Patient ambulatory. Continue scds.     Kiyani Jernigan 05/22/2011 12:06 PM Pager: [phone].

## 2011-05-23 LAB — COMPREHENSIVE METABOLIC PANEL
ALT: 56 U/L — ABNORMAL HIGH (ref 0–53)
AST: 37 U/L (ref 0–37)
Albumin: 3.3 g/dL — ABNORMAL LOW (ref 3.5–5.2)
CO2: 27 mEq/L (ref 19–32)
Calcium: 13.5 mg/dL (ref 8.4–10.5)
Chloride: 102 mEq/L (ref 96–112)
GFR calc non Af Amer: >90 mL/min (ref >90)
Sodium: 139 mEq/L (ref 135–145)
Total Bilirubin: 0.4 mg/dL (ref 0.3–1.2)

## 2011-05-23 LAB — CBC
Platelets: 214 10*3/uL (ref 150–400)
RBC: 4.88 MIL/uL (ref 4.22–5.81)
WBC: 7 10*3/uL (ref 4.0–10.5)

## 2011-05-23 LAB — FECAL LACTOFERRIN, QUANT

## 2011-05-23 LAB — PROTEIN ELECTROPHORESIS, SERUM
Albumin ELP: 54.3 % — ABNORMAL LOW (ref 55.8–66.1)
Alpha-1-Globulin: 6.1 % — ABNORMAL HIGH (ref 2.9–4.9)
Beta 2: 5.8 % (ref 3.2–6.5)
Beta Globulin: 5.2 % (ref 4.7–7.2)
Gamma Globulin: 13.2 % (ref 11.1–18.8)

## 2011-05-23 LAB — LIPID PANEL
Cholesterol: 89 mg/dL (ref 0–200)
HDL: 66 mg/dL (ref >39)
Total CHOL/HDL Ratio: 1.3 RATIO
VLDL: 6 mg/dL (ref 0–40)

## 2011-05-23 MED ORDER — ATENOLOL 50 MG PO TABS
50.0000 mg | ORAL_TABLET | Freq: Every day | ORAL | Status: DC
Start: 2011-05-24 — End: 2011-05-24
  Administered 2011-05-24: 50 mg via ORAL
  Filled 2011-05-23: qty 1

## 2011-05-23 MED ORDER — FUROSEMIDE 10 MG/ML IJ SOLN
40.0000 mg | Freq: Once | INTRAMUSCULAR | Status: AC
  Administered 2011-05-23: 40 mg via INTRAVENOUS
  Filled 2011-05-23: qty 4

## 2011-05-23 MED ORDER — METHIMAZOLE 10 MG PO TABS
20.0000 mg | ORAL_TABLET | Freq: Two times a day (BID) | ORAL | Status: DC
  Administered 2011-05-23 – 2011-05-24 (×2): 20 mg via ORAL
  Filled 2011-05-23 (×3): qty 2

## 2011-05-23 NOTE — Progress Notes (Signed)
CRITICAL VALUE ALERT  Critical value received:  Calcium: 13.5   Date of notification:  05/23/11      Time of notification:  0815  Critical value read back:yes  Nurse who received alert:  Albina Billet   MD notified (1st page):  Dr. Venetia Constable  Time of first [id number]:[id number]  MD notified (2nd page):  Time of second page:  Responding MD:  Dr. Venetia Constable  Time MD responded: [phone]

## 2011-05-23 NOTE — Progress Notes (Signed)
Discussed with Dr Kumar(endocriology, who is happy to see patient in consult after discharge. He believes severe hyperthyroidism responsible for hypercalcemia- with element of dehydration.). Also discussed with radiology yesterday. Patient ca't get thyroid uptake scan as recent contrast- will need to schedule in 6 weeks. SUBJECTIVE feels better. Less diarrhea.   1. Hypercalcemia     History reviewed. No pertinent past medical history. Current Facility-Administered Medications  Medication Dose Route Frequency Provider Last Rate Last Dose  . 0.9 %  sodium chloride infusion   Intravenous Continuous Reba Hulett, MD 125 mL/hr at 05/23/11 1030    . acetaminophen (TYLENOL) tablet 650 mg  650 mg Oral Q6H PRN Therisa Doyne, MD       Or  . acetaminophen (TYLENOL) suppository 650 mg  650 mg Rectal Q6H PRN Therisa Doyne, MD      . albuterol (PROVENTIL) (5 MG/ML) 0.5% nebulizer solution 2.5 mg  2.5 mg Nebulization Q2H PRN Therisa Doyne, MD      . alum & mag hydroxide-simeth (MAALOX/MYLANTA) 200-200-20 MG/5ML suspension 30 mL  30 mL Oral Q6H PRN Therisa Doyne, MD      . atenolol (TENORMIN) tablet 50 mg  50 mg Oral Daily Earlena Werst, MD      . furosemide (LASIX) injection 40 mg  40 mg Intravenous Once Clearence Vitug, MD   40 mg at 05/23/11 0928  . guaiFENesin-dextromethorphan (ROBITUSSIN DM) 100-10 MG/5ML syrup 5 mL  5 mL Oral Q4H PRN Therisa Doyne, MD      . HYDROcodone-acetaminophen (NORCO) 5-325 MG per tablet 1-2 tablet  1-2 tablet Oral Q4H PRN Therisa Doyne, MD      . loperamide (IMODIUM) capsule 4 mg  4 mg Oral PRN Aslyn Cottman, MD      . methimazole (TAPAZOLE) tablet 20 mg  20 mg Oral BID Jadarian Mckay, MD      . ondansetron (ZOFRAN) tablet 4 mg  4 mg Oral Q6H PRN Therisa Doyne, MD       Or  . ondansetron (ZOFRAN) injection 4 mg  4 mg Intravenous Q6H PRN Anastassia Doutova, MD      . sodium chloride 0.9 % injection 3 mL  3 mL Intravenous Q12H Therisa Doyne, MD      . DISCONTD: atenolol (TENORMIN) tablet 25 mg  25 mg Oral Daily Phenix Vandermeulen, MD   25 mg at 05/23/11 0928  . DISCONTD: methimazole (TAPAZOLE) tablet 10 mg  10 mg Oral Daily Rivers Gassmann, MD   10 mg at 05/23/11 0928   No Known Allergies Principal Problem:  *Hypercalcemia Active Problems:  Diarrhea  Abnormal loss of weight  Anemia  Cough   Vital signs in last 24 hours: Temp:  [98.3 F (36.8 C)-99.6 F (37.6 C)] 98.3 F (36.8 C) (04/12 0500) Pulse Rate:  [88-101] 97  (04/12 0928) Resp:  [20] 20  (04/12 0500) BP: (142-171)/(82-94) 142/82 mmHg (04/12 0928) SpO2:  [97 %-100 %] 97 % (04/12 0500) Weight change:  Last BM Date: 05/22/11  Intake/Output from previous day: 04/11 0701 - 04/12 0700 In: 2325 [P.O.:1200; I.V.:1125] Out: 7 [Urine:4; Stool:3] Intake/Output this shift:    Lab Results:  Basename 05/23/11 0610 05/22/11 0510  WBC 7.0 9.7  HGB 12.3* 11.2*  HCT 36.7* 33.3*  PLT 214 181   BMET  Basename 05/23/11 0610 05/22/11 0510  NA 139 139  K 3.6 3.7  CL 102 104  CO2 27 27  GLUCOSE 101* 96  BUN 12 12  CREATININE 0.66 0.65  CALCIUM 13.5* 12.6*  Studies/Results: Dg Chest 2 View  05/21/2011  *RADIOLOGY REPORT*  Clinical Data: Cough, congestion.  CHEST - 2 VIEW  Comparison: None.  Findings: Heart and mediastinal contours are within normal limits. No focal opacities or effusions.  No acute bony abnormality.  IMPRESSION: No active cardiopulmonary disease.  Original Report Authenticated By: Cyndie Chime, M.D.   US Soft Tissue Head/neck  05/22/2011  *RADIOLOGY REPORT*  Clinical Data: Hyperthyroidism  THYROID ULTRASOUND  Technique: Ultrasound examination of the thyroid gland and adjacent soft tissues was performed.  Comparison:  None  Findings:  Right thyroid lobe:  33 x 36 x 75 mm, inhomogeneous Left thyroid lobe:  29 x 34 x 68 mm Isthmus:  9.3 mm in thickness  Focal nodules:  None  Lymphadenopathy:  None visualized.  IMPRESSION:  Thyromegaly without  discrete nodule or other focal lesion.  Original Report Authenticated By: Osa Craver, M.D.   Ct Abdomen Pelvis W Contrast  05/21/2011  *RADIOLOGY REPORT*  Clinical Data: Abdominal pain.  Weight loss.  Nausea and vomiting. Diarrhea.  CT ABDOMEN AND PELVIS WITH CONTRAST  Technique:  Multidetector CT imaging of the abdomen and pelvis was performed following the standard protocol during bolus administration of intravenous contrast.  Contrast: OMNIPAQUE IOHEXOL 300 MG/ML  SOLN  Comparison: None.  Findings: Contrast opacity is poor for some reason.  The technologist states that the scan proceeded normally.  Lung bases are clear.  No pleural or pericardial fluid.  The liver has a normal appearance without focal lesions or biliary ductal dilatation. The gallbladder appears collapsed but unremarkable.  The spleen is normal.  The pancreas is normal.  The adrenal glands are normal.  The kidneys are normal.  The aorta and IVC are normal.  No retroperitoneal mass or adenopathy.  No free intraperitoneal fluid or air.  No bowel pathology is seen.  The appendix is normal.  Bladder, prostate gland and seminal vesicles are unremarkable.  No significant bony finding.  IMPRESSION: Poor contrast opacity, reason unknown.  No pathologic finding.  Original Report Authenticated By: Thomasenia Sales, M.D.    Medications: I have reviewed the patient's current medications.   Physical exam GENERAL- alert HEAD- normal atraumatic, no neck masses, normal thyroid, no jvd RESPIRATORY- appears well, vitals normal, no respiratory distress, acyanotic, normal RR, ear and throat exam is normal, neck free of mass or lymphadenopathy, chest clear, no wheezing, crepitations, rhonchi, normal symmetric air entry CVS- regular rate and rhythm, S1, S2 normal, no murmur, click, rub or gallop ABDOMEN- abdomen is soft without significant tenderness, masses, organomegaly or guarding NEURO- Grossly normal EXTREMITIES- extremities normal,  atraumatic, no cyanosis or edema  Plan  * Primary hyperthyroidism- ?Graves' disease. Some thyromegaly. Increase methimazole per Dr Lucianne Muss recommendation. Increase atenolol. *Hypercalcemia- likely due to hyperthyroidism/dehydration. Gave lasix dose. Continue iVF.  * Diarrhea/ Abnormal loss of weight/ Anemia- related to hyperthyroidism?- monitor.  Patient ambulatory. Continue scds. If calcium trending down, possible d/c in am.     Cayla Wiegand 05/23/2011 11:36 AM Pager: [phone].

## 2011-05-23 NOTE — ED Provider Notes (Signed)
See prior note   Ward Givens, MD 05/23/11 1727

## 2011-05-24 DIAGNOSIS — E0590 Thyrotoxicosis, unspecified without thyrotoxic crisis or storm: Secondary | ICD-10-CM | POA: Diagnosis present

## 2011-05-24 LAB — COMPREHENSIVE METABOLIC PANEL
ALT: 49 U/L (ref 0–53)
AST: 28 U/L (ref 0–37)
Albumin: 3.2 g/dL — ABNORMAL LOW (ref 3.5–5.2)
CO2: 27 mEq/L (ref 19–32)
Calcium: 13.2 mg/dL (ref 8.4–10.5)
Creatinine, Ser: 0.68 mg/dL (ref 0.50–1.35)
Sodium: 142 mEq/L (ref 135–145)
Total Protein: 6.1 g/dL (ref 6.0–8.3)

## 2011-05-24 LAB — CALCITONIN: Calcitonin: 3 pg/mL

## 2011-05-24 LAB — CBC
MCH: 25.3 pg — ABNORMAL LOW (ref 26.0–34.0)
Platelets: 221 10*3/uL (ref 150–400)
RBC: 4.91 MIL/uL (ref 4.22–5.81)
RDW: 12.7 % (ref 11.5–15.5)

## 2011-05-24 LAB — PHOSPHORUS: Phosphorus: 4.9 mg/dL — ABNORMAL HIGH (ref 2.3–4.6)

## 2011-05-24 LAB — VITAMIN D 1,25 DIHYDROXY: Vitamin D 1, 25 (OH)2 Total: <8 pg/mL — ABNORMAL LOW (ref 18–72)

## 2011-05-24 MED ORDER — SODIUM CHLORIDE 0.9 % IV SOLN
90.0000 mg | Freq: Once | INTRAVENOUS | Status: AC
  Administered 2011-05-24: 90 mg via INTRAVENOUS
  Filled 2011-05-24: qty 10

## 2011-05-24 MED ORDER — ATENOLOL 50 MG PO TABS: 50.0000 mg | ORAL_TABLET | Freq: Every day | ORAL | Status: DC

## 2011-05-24 MED ORDER — MAGNESIUM OXIDE 400 MG PO TABS
800.0000 mg | ORAL_TABLET | Freq: Once | ORAL | Status: AC
  Administered 2011-05-24: 800 mg via ORAL
  Filled 2011-05-24: qty 2

## 2011-05-24 MED ORDER — METHIMAZOLE 20 MG PO TABS: 20.0000 mg | ORAL_TABLET | Freq: Two times a day (BID) | ORAL | Status: DC

## 2011-05-24 NOTE — Discharge Instructions (Signed)
Hypercalcemia Hypercalcemia means the calcium in your blood is too high. A level above 10.5 milligrams per deciliter of blood is considered high. Calcium in our blood is important for the control of many things, such as:  Blood clotting.   Conducting of nerve impulses.   Muscle contraction.   Maintaining teeth and bone health.   Other body functions.  In the bloodstream, calcium maintains a constant balance with another mineral, phosphate. Calcium is absorbed into the body through the small intestine. This is helped by Vitamin D. Calcium levels are maintained mostly by vitamin D and a hormone (parathyroid hormone). But the kidneys also help. Hypercalcemia can happen when the concentration of calcium is too high for the kidneys to maintain balance. The body maintains a balance between the calcium we eat and the calcium already in our body. If calcium intake is increased or we cannot use calcium properly, there may be problems. Some common sources of calcium are:   Dairy products.   Nuts.   Eggs.   Whole grains.   Legumes.   Green leafy vegetables.  CAUSES There are many causes of this condition, but some common ones are:  Hyperparathyroidism. This is an over activity of the parathyroid gland.   Cancers of the breast, kidney, lung, head and neck are common causes of calcium increases.   Medications that cause you to urinate more often (diuretics), nausea, vomiting and diarrhea also increase the calcium in the blood.   Overuse of calcium-containing antacids.  SYMPTOMS  Many patients with mild hypercalcemia have no symptoms. For those with symptoms common problems include:  Loss of appetite.   Constipation.   Increased thirst.   Heart rhythm changes.   Abnormal thinking.   Nausea.   Abdominal pain.   Kidney stones.   Mood swings.   Coma and death when severe.   Vomiting.   Increased urination.   High blood pressure.   Confusion.  DIAGNOSIS   Your  caregiver will do a medical history and perform a physical exam on you.   Calcium and parathyroid hormone (PTH) may be measured with a blood test.  TREATMENT   The treatment depends on the calcium level and what is causing the higher level. Hypercalcemia can be lifethreatening. Fast lowering of the calcium level may be necessary.   With normal kidney function, fluids can be given by vein to clear the excess calcium. Hemodialysis works well to reduce dangerous calcium levels if there is poor kidney function. This is a procedure in which a machine is used to filter out unwanted substances. The blood is then returned to the body.   Drugs, such as diuretics, can be given after adequate fluid intake is established. These medications help the kidneys get rid of extra calcium. Drugs that lessen (inhibit) bone loss are helpful in gaining long-term control. Phosphate pills help lower high calcium levels caused by a low supply of phosphate. Anti-inflammatory agents such as steroids are helpful with some cancers and toxic levels of vitamin D.   Treatment of the underlying cause of the hypercalcemia will also correct the imbalance. Hyperparathyroidism is usually treated by surgical removal of one or more of the parathyroid glands and any tissue, other than the glands themselves, that is producing too much hormone.   The hypercalcemia caused by cancer is difficult to treat without controlling the cancer. Symptoms can be improved with fluids and drug therapy as outlined above.  PROGNOSIS   Surgery to remove the parathyroid glands is usually successful.  This also depends on the amount of damage to the kidneys and whether or not it can be treated.   Mild hypercalcemia can be controlled with good fluid intake and the use of effective medications.   Hypercalcemia often develops as a late complication of cancer. The expected outlook is poor without effective anticancer therapy.  PREVENTION   If you are at risk  for developing hypercalcemia, be familiar with early symptoms. Report these to your caregiver.   Good fluid intake (up to four quarts of liquid a day if possible) is helpful.   Try to control nausea and vomiting, and treat fevers to avoid dehydration.   Lowering the amount of calcium in your diet is not necessary. High blood calcium reduces absorption of calcium in the intestine.   Stay as active as possible.  SEEK IMMEDIATE MEDICAL CARE IF:   You develop chest pain, sweating, or shortness of breath.   You get confused, feel faint or pass out.   You develop severe nausea and vomiting.  MAKE SURE YOU:   Understand these instructions.   Will watch your condition.   Will get help right away if you are not doing well or get worse.  Document Released: 04/12/2004 Document Revised: 01/16/2011 Document Reviewed: 01/22/2010 [facility] Patient Information 2012 [place], [place].Hyperthyroidism The thyroid is a large gland located in the lower front part of your neck. The thyroid helps control metabolism. Metabolism is how your body uses food. It controls metabolism with the hormone thyroxine. When the thyroid is overactive, it produces too much hormone. When this happens, these following problems may occur:   Nervousness   Heat intolerance   Weight loss (in spite of increase food intake)   Diarrhea   Change in hair or skin texture   Palpitations (heart skipping or having extra beats)   Tachycardia (rapid heart rate)   Loss of menstruation (amenorrhea)   Shaking of the hands  CAUSES  Grave's Disease (the immune system attacks the thyroid gland). This is the most common cause.   Inflammation of the thyroid gland.   Tumor (usually benign) in the thyroid gland or elsewhere.   Excessive use of thyroid medications (both prescription and 'natural').   Excessive ingestion of Iodine.  DIAGNOSIS  To prove hyperthyroidism, your caregiver may do blood tests and ultrasound tests.  Sometimes the signs are hidden. It may be necessary for your caregiver to watch this illness with blood tests, either before or after diagnosis and treatment. TREATMENT Short-term treatment There are several treatments to control symptoms. Drugs called beta blockers may give some relief. Drugs that decrease hormone production will provide temporary relief in many people. These measures will usually not give permanent relief. Definitive therapy There are treatments available which can be discussed between you and your caregiver which will permanently treat the problem. These treatments range from surgery (removal of the thyroid), to the use of radioactive iodine (destroys the thyroid by radiation), to the use of antithyroid drugs (interfere with hormone synthesis). The first two treatments are permanent and usually successful. They most often require hormone replacement therapy for life. This is because it is impossible to remove or destroy the exact amount of thyroid required to make a person euthyroid (normal). HOME CARE INSTRUCTIONS  See your caregiver if the problems you are being treated for get worse. Examples of this would be the problems listed above. SEEK MEDICAL CARE IF: Your general condition worsens. MAKE SURE YOU:   Understand these instructions.   Will watch your  condition.   Will get help right away if you are not doing well or get worse.  Document Released: 01/27/2005 Document Revised: 01/16/2011 Document Reviewed: 06/10/2006 [facility] Patient Information 2012 [place], [place].

## 2011-05-24 NOTE — Discharge Summary (Signed)
DISCHARGE SUMMARY  Greg Harrison  Greg#: [id number]  DOB:[birth year]  Date of Admission: 05/21/2011 Date of Discharge: 05/24/2011  Attending Physician:Greg Harrison  Patient's WUJ:WJXBJYN,WGNFAOZH, MD, MD  Consults: Dr Greg Harrison, Greg Harrison(over the phone).  Discharge Diagnoses: Present on Admission:  .Diarrhea .Abnormal loss of weight .Hypercalcemia .Anemia .Cough .Primary hyperthyroidism   Hospital Course: Greg Harrison is a pleasant 31 year old gentleman who came in with a 60lb weight loss over a 2 month period, which was associated with diarrhea and feeling weak. 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 Lab tests showed primary hyperthyroidism, with a tsh<0.001, and free T4 of 11.4. He was also hypercalcemic with calcium of 13.2 at discharge(he received ivf/lasix, and pamidronate). The calcium will need follow up. I expect it to improve with the dose of pamidronate given prior to discharge, and with better control of the hyperthyroidism. An ultrasound of the thyroid showed diffuse thyromegaly, with no obvious nodules. Unfortunately, patient could not receive thyroid uptake scan as he had just received iv contrast for abdominal ct. He will need the scan rescheduled to at least 6 weeks. I am deferring this decision to Dr Greg Harrison, who graciously discussed the case with me over the phone. I have referred Greg Harrison to Dr Greg Harrison. He will call for an appointment. Greg Harrison has improved clinically with interventions in the hospital, and should continue methimazole and atenolol. He has some microcytic anemia, which will need further work up once this acute phase has resolved.   Medication List  As of 05/24/2011  1:37 PM   STOP taking these medications         ibuprofen 200 MG tablet      IRON PO      VITAMIN C PO         TAKE these medications         atenolol 50 MG tablet   Commonly known as: TENORMIN   Take 1 tablet (50 mg total) by mouth daily.     methimazole 20 MG tablet   Commonly known as: TAPAZOLE   Take 1 tablet (20 mg total) by mouth 2 (two) times daily.      MULTIVITAMIN PO   Take 1 tablet by mouth daily.             Day of Discharge BP 141/82  Pulse 86  Temp(Src) 98.3 F (36.8 C) (Oral)  Resp 16  Ht 6\' 6"  (1.981 m)  Wt 89.495 kg (197 lb 4.8 oz)  BMI 22.80 kg/m2  SpO2 95%  Physical Exam: At baseline.  Results for orders placed during the hospital encounter of 05/21/11 (from the past 24 hour(s))  CBC     Status: Abnormal   Collection Time   05/24/11  5:00 AM      Component Value Range   WBC 5.8  4.0 - 10.5 (K/uL)   RBC 4.91  4.22 - 5.81 (MIL/uL)   Hemoglobin 12.4 (*) 13.0 - 17.0 (g/dL)   HCT 08.6 (*) 57.8 - 52.0 (%)   MCV 74.3 (*) 78.0 - 100.0 (fL)   MCH 25.3 (*) 26.0 - 34.0 (pg)   MCHC 34.0  30.0 - 36.0 (g/dL)   RDW 46.9  62.9 - 52.8 (%)   Platelets 221  150 - 400 (K/uL)  COMPREHENSIVE METABOLIC PANEL     Status: Abnormal   Collection Time   05/24/11  5:00 AM      Component Value Range   Sodium 142  135 -  145 (mEq/L)   Potassium 3.6  3.5 - 5.1 (mEq/L)   Chloride 106  96 - 112 (mEq/L)   CO2 27  19 - 32 (mEq/L)   Glucose, Bld 94  70 - 99 (mg/dL)   BUN 14  6 - 23 (mg/dL)   Creatinine, Ser 2.95  0.50 - 1.35 (mg/dL)   Calcium 62.1 (*) 8.4 - 10.5 (mg/dL)   Total Protein 6.1  6.0 - 8.3 (g/dL)   Albumin 3.2 (*) 3.5 - 5.2 (g/dL)   AST 28  0 - 37 (U/L)   ALT 49  0 - 53 (U/L)   Alkaline Phosphatase 113  39 - 117 (U/L)   Total Bilirubin 0.4  0.3 - 1.2 (mg/dL)   GFR calc non Af Amer >90  >90 (mL/min)   GFR calc Af Amer >90  >90 (mL/min)  MAGNESIUM     Status: Abnormal   Collection Time   05/24/11  5:00 AM      Component Value Range   Magnesium 1.4 (*) 1.5 - 2.5 (mg/dL)  PHOSPHORUS     Status: Abnormal   Collection Time   05/24/11  5:00 AM      Component Value Range   Phosphorus 4.9 (*) 2.3 - 4.6 (mg/dL)    Disposition: home today to follow with Dr Greg Harrison in 1-2 weeks.   Follow-up  Appts: Discharge Orders    Future Orders Please Complete By Expires   Diet - low sodium heart healthy      Increase activity slowly         Follow-up Information    Follow up with [facility], [place].   Contact information:   [address] [facility] [place] [address] [phone]          Tests Needing Follow-up: Thyroid uptake scan in 6 weeks.  Time spent in discharge (includes decision making & examination of pt): 25 minutes  Signed: Elizardo Harrison 05/24/2011, 1:37 PM

## 2011-05-24 NOTE — Progress Notes (Signed)
Critical level call from lab. Calcium 13.2 this down from yesterday. MD made aware.

## 2011-05-25 ENCOUNTER — Inpatient Hospital Stay (HOSPITAL_COMMUNITY)
Admission: EM | Admit: 2011-05-25 | Discharge: 2011-05-27 | DRG: 644 | Disposition: A | Discharge status: Home / Self Care | Payer: No Typology Code available for payment source | Source: Ambulatory Visit | Attending: Internal Medicine | Admitting: Internal Medicine

## 2011-05-25 ENCOUNTER — Encounter (HOSPITAL_COMMUNITY): Payer: Self-pay | Admitting: *Deleted

## 2011-05-25 ENCOUNTER — Emergency Department (HOSPITAL_COMMUNITY): Payer: No Typology Code available for payment source

## 2011-05-25 DIAGNOSIS — R509 Fever, unspecified: Secondary | ICD-10-CM | POA: Diagnosis present

## 2011-05-25 DIAGNOSIS — R7401 Elevation of levels of liver transaminase levels: Secondary | ICD-10-CM | POA: Diagnosis not present

## 2011-05-25 DIAGNOSIS — N390 Urinary tract infection, site not specified: Secondary | ICD-10-CM | POA: Diagnosis present

## 2011-05-25 DIAGNOSIS — D509 Iron deficiency anemia, unspecified: Secondary | ICD-10-CM | POA: Diagnosis present

## 2011-05-25 DIAGNOSIS — E0590 Thyrotoxicosis, unspecified without thyrotoxic crisis or storm: Principal | ICD-10-CM | POA: Diagnosis present

## 2011-05-25 DIAGNOSIS — R7402 Elevation of levels of lactic acid dehydrogenase (LDH): Secondary | ICD-10-CM | POA: Diagnosis present

## 2011-05-25 DIAGNOSIS — T382X5A Adverse effect of antithyroid drugs, initial encounter: Secondary | ICD-10-CM | POA: Diagnosis present

## 2011-05-25 LAB — COMPREHENSIVE METABOLIC PANEL
Alkaline Phosphatase: 105 U/L (ref 39–117)
BUN: 14 mg/dL (ref 6–23)
CO2: 26 mEq/L (ref 19–32)
Chloride: 100 mEq/L (ref 96–112)
Creatinine, Ser: 0.82 mg/dL (ref 0.50–1.35)
GFR calc Af Amer: >90 mL/min (ref >90)
GFR calc non Af Amer: >90 mL/min (ref >90)
Glucose, Bld: 95 mg/dL (ref 70–99)
Total Bilirubin: 0.5 mg/dL (ref 0.3–1.2)

## 2011-05-25 LAB — CBC
HCT: 35.5 % — ABNORMAL LOW (ref 39.0–52.0)
HCT: 33.7 % — ABNORMAL LOW (ref 39.0–52.0)
Hemoglobin: 12 g/dL — ABNORMAL LOW (ref 13.0–17.0)
MCH: 25.2 pg — ABNORMAL LOW (ref 26.0–34.0)
MCV: 74.9 fL — ABNORMAL LOW (ref 78.0–100.0)
MCV: 74.4 fL — ABNORMAL LOW (ref 78.0–100.0)
Platelets: 195 10*3/uL (ref 150–400)
RBC: 4.74 MIL/uL (ref 4.22–5.81)
RDW: 13 % (ref 11.5–15.5)
WBC: 10.4 10*3/uL (ref 4.0–10.5)
WBC: 10 10*3/uL (ref 4.0–10.5)

## 2011-05-25 LAB — DIFFERENTIAL
Basophils Absolute: 0 10*3/uL (ref 0.0–0.1)
Eosinophils Relative: 0 % (ref 0–5)
Lymphocytes Relative: 3 % — ABNORMAL LOW (ref 12–46)
Lymphs Abs: 0.3 10*3/uL — ABNORMAL LOW (ref 0.7–4.0)
Monocytes Absolute: 0.4 10*3/uL (ref 0.1–1.0)
Neutro Abs: 9.7 10*3/uL — ABNORMAL HIGH (ref 1.7–7.7)

## 2011-05-25 LAB — URINALYSIS, ROUTINE W REFLEX MICROSCOPIC
Ketones, ur: NEGATIVE mg/dL
Protein, ur: NEGATIVE mg/dL
Urobilinogen, UA: 1 mg/dL (ref 0.0–1.0)

## 2011-05-25 LAB — URINE MICROSCOPIC-ADD ON

## 2011-05-25 LAB — POCT I-STAT, CHEM 8
Calcium, Ion: 1.51 mmol/L — ABNORMAL HIGH (ref 1.12–1.32)
Hemoglobin: 12.6 g/dL — ABNORMAL LOW (ref 13.0–17.0)
Sodium: 135 mEq/L (ref 135–145)
TCO2: 26 mmol/L (ref 0–100)

## 2011-05-25 LAB — CREATININE, SERUM: GFR calc Af Amer: >90 mL/min (ref >90)

## 2011-05-25 MED ORDER — CIPROFLOXACIN IN D5W 400 MG/200ML IV SOLN
400.0000 mg | Freq: Two times a day (BID) | INTRAVENOUS | Status: DC
  Administered 2011-05-25 – 2011-05-26 (×3): 400 mg via INTRAVENOUS
  Filled 2011-05-25 (×4): qty 200

## 2011-05-25 MED ORDER — SODIUM CHLORIDE 0.9 % IV SOLN
INTRAVENOUS | Status: DC
Start: 2011-05-25 — End: 2011-05-25

## 2011-05-25 MED ORDER — SODIUM CHLORIDE 0.9 % IV SOLN: INTRAVENOUS | Status: DC

## 2011-05-25 MED ORDER — HYDROCORTISONE SOD SUCCINATE 100 MG IJ SOLR
50.0000 mg | Freq: Three times a day (TID) | INTRAMUSCULAR | Status: DC
  Administered 2011-05-25 – 2011-05-26 (×3): 50 mg via INTRAVENOUS
  Filled 2011-05-25 (×6): qty 1

## 2011-05-25 MED ORDER — METHYLPREDNISOLONE SODIUM SUCC 125 MG IJ SOLR
125.0000 mg | Freq: Once | INTRAMUSCULAR | Status: AC
  Administered 2011-05-25: 125 mg via INTRAVENOUS
  Filled 2011-05-25: qty 2

## 2011-05-25 MED ORDER — ACETAMINOPHEN 650 MG RE SUPP: 650.0000 mg | Freq: Four times a day (QID) | RECTAL | Status: DC | PRN

## 2011-05-25 MED ORDER — ACETAMINOPHEN 500 MG PO TABS
1000.0000 mg | ORAL_TABLET | Freq: Once | ORAL | Status: AC
  Administered 2011-05-25: 1000 mg via ORAL
  Filled 2011-05-25: qty 2

## 2011-05-25 MED ORDER — ONDANSETRON HCL 4 MG PO TABS: 4.0000 mg | ORAL_TABLET | Freq: Four times a day (QID) | ORAL | Status: DC | PRN

## 2011-05-25 MED ORDER — ENOXAPARIN SODIUM 40 MG/0.4ML ~~LOC~~ SOLN: 40.0000 mg | SUBCUTANEOUS | Status: DC

## 2011-05-25 MED ORDER — IBUPROFEN 800 MG PO TABS
800.0000 mg | ORAL_TABLET | Freq: Once | ORAL | Status: AC
  Administered 2011-05-25: 800 mg via ORAL
  Filled 2011-05-25: qty 1

## 2011-05-25 MED ORDER — SODIUM CHLORIDE 0.9 % IJ SOLN
3.0000 mL | Freq: Two times a day (BID) | INTRAMUSCULAR | Status: DC
  Administered 2011-05-26: 10:00:00 via INTRAVENOUS

## 2011-05-25 MED ORDER — METHIMAZOLE 10 MG PO TABS
20.0000 mg | ORAL_TABLET | Freq: Three times a day (TID) | ORAL | Status: DC
  Administered 2011-05-25 – 2011-05-26 (×4): 20 mg via ORAL
  Filled 2011-05-25 (×7): qty 2

## 2011-05-25 MED ORDER — METRONIDAZOLE IN NACL 5-0.79 MG/ML-% IV SOLN
500.0000 mg | Freq: Three times a day (TID) | INTRAVENOUS | Status: DC
  Administered 2011-05-25 – 2011-05-26 (×3): 500 mg via INTRAVENOUS
  Filled 2011-05-25 (×5): qty 100

## 2011-05-25 MED ORDER — ZOLPIDEM TARTRATE 5 MG PO TABS: 5.0000 mg | ORAL_TABLET | Freq: Every evening | ORAL | Status: DC | PRN

## 2011-05-25 MED ORDER — MORPHINE SULFATE 2 MG/ML IJ SOLN: 2.0000 mg | INTRAMUSCULAR | Status: DC | PRN

## 2011-05-25 MED ORDER — PANTOPRAZOLE SODIUM 40 MG PO TBEC
40.0000 mg | DELAYED_RELEASE_TABLET | Freq: Every day | ORAL | Status: DC
  Administered 2011-05-25 – 2011-05-27 (×3): 40 mg via ORAL
  Filled 2011-05-25 (×3): qty 1

## 2011-05-25 MED ORDER — SODIUM CHLORIDE 0.9 % IV SOLN
INTRAVENOUS | Status: DC
  Administered 2011-05-25 – 2011-05-26 (×2): via INTRAVENOUS

## 2011-05-25 MED ORDER — HYDROCODONE-ACETAMINOPHEN 5-325 MG PO TABS: 1.0000 | ORAL_TABLET | ORAL | Status: DC | PRN

## 2011-05-25 MED ORDER — ONDANSETRON HCL 4 MG/2ML IJ SOLN: 4.0000 mg | Freq: Four times a day (QID) | INTRAMUSCULAR | Status: DC | PRN

## 2011-05-25 MED ORDER — ATENOLOL 25 MG PO TABS
25.0000 mg | ORAL_TABLET | Freq: Two times a day (BID) | ORAL | Status: DC
  Administered 2011-05-25 – 2011-05-27 (×5): 25 mg via ORAL
  Filled 2011-05-25 (×7): qty 1

## 2011-05-25 MED ORDER — ACETAMINOPHEN 325 MG PO TABS: 650.0000 mg | ORAL_TABLET | Freq: Four times a day (QID) | ORAL | Status: DC | PRN

## 2011-05-25 NOTE — Progress Notes (Signed)
05/25/11 0924  Discharge Planning  Type of Residence Private residence  Living Arrangements Spouse/significant other  Home Care Services No  Support Systems Spouse/significant other  Do you have any problems obtaining your medications? No  Once you are discharged, how will you get to your follow-up appointment? Self;Family  Case Management Consult Needed No  Social Work Consult Needed No   Dionne Milo MSW [facility] [place] [facility]. Weekend/Social Worker [phone]

## 2011-05-25 NOTE — ED Provider Notes (Signed)
History     CSN: [id number]  Arrival date & time 05/25/11  0203   First MD Initiated Contact with Patient 05/25/11 0500      Chief Complaint  Patient presents with  . temp nv     (Consider location/radiation/quality/duration/timing/severity/associated sxs/prior treatment) HPI Comments: The patient is a 31 year old male who presents to the hospital with fever nausea vomiting and diarrhea. According to the medical record the patient and his spouse he was recently admitted to the hospital for hyperthyroidism which had resulted in a 60 pound weight loss, tremor, tachycardia and fever. He was treated in the hospital for hypercalcemia and started on atenolol and methimazole. Over the course of the last 24 hours since he was discharged he is had recurrence of his fever up to 102 at home, has had one episode of vomiting persistent diarrhea. His spouse has noticed that he has had a continued tremor and feels like he has ongoing severe symptoms. The ultrasound of his thyroid during the hospital admission showed diffuse thyromegaly.he was unable to have a further thyroid testing to do a CT scan of the abdomen that was performed with contrast. This was supposed to be scheduled as an outpatient.  These are recurrent, moderate to severe, not associated with coughing, shortness of breath, rashes.  Endo :  Dr. Lucianne Muss  The history is provided by the patient, the spouse and medical records.    History reviewed. No pertinent past medical history.  History reviewed. No pertinent past surgical history.  Family History  Problem Relation Age of Onset  . Heart disease Father   . Hypertension Other     History  Substance Use Topics  . Smoking status: Former Games developer  . Smokeless tobacco: Not on file  . Alcohol Use: No      Review of Systems  All other systems reviewed and are negative.    Allergies  Review of patient's allergies indicates no known allergies.  Home Medications   Current  Outpatient Rx  Name Route Sig Dispense Refill  . ATENOLOL 50 MG PO TABS Oral Take 1 tablet (50 mg total) by mouth daily. 30 tablet 0  . METHIMAZOLE 20 MG PO TABS Oral Take 1 tablet (20 mg total) by mouth 2 (two) times daily. 60 tablet 0  . MULTIVITAMIN PO Oral Take 1 tablet by mouth daily.       BP 143/76  Pulse 115  Temp(Src) 103 F (39.4 C) (Oral)  Resp 22  Ht 6.6" (0.168 m)  Wt 215 lb (97.523 kg)  BMI 3,470.18 kg/m2  SpO2 99%  Physical Exam  Nursing note and vitals reviewed. Constitutional: He appears well-developed and well-nourished.       Anxious appearing  HENT:  Head: Normocephalic and atraumatic.  Mouth/Throat: Oropharynx is clear and moist. No oropharyngeal exudate.  Eyes: Conjunctivae and EOM are normal. Pupils are equal, round, and reactive to light. Right eye exhibits no discharge. Left eye exhibits no discharge. No scleral icterus.  Neck: Normal range of motion. Neck supple. No JVD present. No thyromegaly present.  Cardiovascular: Regular rhythm, normal heart sounds and intact distal pulses.  Exam reveals no gallop and no friction rub.   No murmur heard.      Tachycardia to 115  Pulmonary/Chest: Effort normal and breath sounds normal. No respiratory distress. He has no wheezes. He has no rales.  Abdominal: Soft. Bowel sounds are normal. He exhibits no distension and no mass. There is no tenderness.  Musculoskeletal: Normal range of motion. He  exhibits no edema and no tenderness.  Lymphadenopathy:    He has no cervical adenopathy.  Neurological: He is alert. Coordination normal.  Skin: Skin is warm and dry. No rash noted. No erythema.  Psychiatric: He has a normal mood and affect. His behavior is normal.    ED Course  Procedures (including critical care time)  Labs Reviewed  CBC - Abnormal; Notable for the following:    Hemoglobin 12.0 (*)    HCT 35.5 (*)    MCV 74.9 (*)    MCH 25.3 (*)    All other components within normal limits  DIFFERENTIAL -  Abnormal; Notable for the following:    Neutrophils Relative 93 (*)    Neutro Abs 9.7 (*)    Lymphocytes Relative 3 (*)    Lymphs Abs 0.3 (*)    All other components within normal limits  COMPREHENSIVE METABOLIC PANEL - Abnormal; Notable for the following:    Calcium 11.7 (*)    Albumin 3.2 (*)    All other components within normal limits  URINALYSIS, ROUTINE W REFLEX MICROSCOPIC - Abnormal; Notable for the following:    APPearance CLOUDY (*)    Leukocytes, UA TRACE (*)    All other components within normal limits  POCT I-STAT, CHEM 8 - Abnormal; Notable for the following:    Calcium, Ion 1.51 (*)    Hemoglobin 12.6 (*)    HCT 37.0 (*)    All other components within normal limits  URINE MICROSCOPIC-ADD ON - Abnormal; Notable for the following:    Bacteria, UA FEW (*)    All other components within normal limits  CULTURE, BLOOD (ROUTINE X 2)  CULTURE, BLOOD (ROUTINE X 2)   Dg Chest 2 View  05/25/2011  *RADIOLOGY REPORT*  Clinical Data: Fever.  Vomiting.  Diarrhea. Hyperthyroidism.  CHEST - 2 VIEW  Comparison:  05/21/2011  Findings:  The heart size and mediastinal contours are within normal limits.  Both lungs are clear.  The visualized skeletal structures are unremarkable.  IMPRESSION: No active cardiopulmonary disease.  Original Report Authenticated By: Danae Orleans, M.D.     1. Hyperthyroidism   2. Fever       MDM  Has ongoing thyrotoxic appearance, has tachy, fever, but nosources of infx, anticipate need for admission again, fever control, fluids.   Blood cx drawn, labs otherwise normal, CXR neg, UA clear.  Steroids given  Pt admitted to triad for further testing and treatment.  Vida Roller, MD 05/25/11 [phone]

## 2011-05-25 NOTE — ED Notes (Signed)
Report given to floor. Pt transported on cardiac monitor.

## 2011-05-25 NOTE — ED Notes (Signed)
The pt says he just left the hospital for the same symptoms he is having now.  nv and diarrhea with a temp

## 2011-05-25 NOTE — ED Notes (Signed)
Roddrick Sharron (mom) [phone]

## 2011-05-25 NOTE — H&P (Signed)
PCP:   Sheila Oats, MD, MD   Chief Complaint:  Fever, chills, diarrhea.  HPI: Mr Magowan was discharged yesterday under my service, after treatment for newly diagnosed  Thyrotoxicosis. He seemed to have turned the corner, with no fever during the hospital stay, but says he started experiencing fever, vomiting and diarrhea once he got home. He tried to sleep it over, but his wife brought him in early this morning as he was shivering, and was apparently delirious. In ED he had fever of 103, and was tachycardic. CXR was unremarkable, although urine had some bacteria in it. Mr Apuzzo was given solumedrol and IV fluids, and referred for readmission. He feels better now. He says he filled his prescriptions, and took the evening dose of methimazole.  Review of Systems:  Negative except as highlighted in the hpi.  Past Medical History: Hyperthyroidism. History reviewed. No pertinent past surgical history.  Medications: Prior to Admission medications   Medication Sig Start Date End Date Taking? Authorizing Provider  atenolol (TENORMIN) 50 MG tablet Take 1 tablet (50 mg total) by mouth daily. 05/24/11 05/23/12 Yes Frances Joynt, MD  methimazole (TAPAZOLE) 20 MG tablet Take 1 tablet (20 mg total) by mouth 2 (two) times daily. 05/24/11 05/23/12 Yes Briseis Aguilera, MD  Multiple Vitamins-Minerals (MULTIVITAMIN PO) Take 1 tablet by mouth daily.    Yes Historical Provider, MD    Allergies:  No Known Allergies  Social History:  reports that he has quit smoking. He does not have any smokeless tobacco history on file. He reports that he does not drink alcohol or use illicit drugs.  Family History: Family History  Problem Relation Age of Onset  . Heart disease Father   . Hypertension Other     Physical Exam: Filed Vitals:   05/25/11 0450 05/25/11 0456 05/25/11 0648 05/25/11 0743  BP: 143/76   141/64  Pulse:      Temp: 102.1 F (38.9 C)  103 F (39.4 C) 100.3 F (37.9 C)  TempSrc: Oral  Oral  Oral  Resp:    26  Height: 6.6" (0.168 m)     Weight: 97.523 kg (215 lb)     SpO2: 98% 99%  99%   Lying comfortably, not in distress. PERRLA. No jvd. Lungs clear. S1S2 heard. No murmurs. RRR. Abdomen, soft, non tender. +BS. CNS- grossly intact. Extremities- no pedal edema. Peripheral pulses equal.   Labs on Admission:   [facility] 05/25/11 0524 05/25/11 0511 05/24/11 0500  NA 135 135 --  K 3.8 4.0 --  CL 99 100 --  CO2 -- 26 27  GLUCOSE 92 95 --  BUN 14 14 --  CREATININE 1.00 0.82 --  CALCIUM -- 11.7* 13.2*  MG -- -- 1.4*  PHOS -- -- 4.9*    Basename 05/25/11 0511 05/24/11 0500  AST 34 28  ALT 50 49  ALKPHOS 105 113  BILITOT 0.5 0.4  PROT 6.2 6.1  ALBUMIN 3.2* 3.2*   No results found for this basename: LIPASE:2,AMYLASE:2 in the last 72 hours  Basename 05/25/11 0524 05/25/11 0511 05/24/11 0500  WBC -- 10.4 5.8  NEUTROABS -- 9.7* --  HGB 12.6* 12.0* --  HCT 37.0* 35.5* --  MCV -- 74.9* 74.3*  PLT -- 181 221   No results found for this basename: CKTOTAL:3,CKMB:3,CKMBINDEX:3,TROPONINI:3 in the last 72 hours No results found for this basename: TSH,T4TOTAL,FREET3,T3FREE,THYROIDAB in the last 72 hours No results found for this basename: VITAMINB12:2,FOLATE:2,FERRITIN:2,TIBC:2,IRON:2,RETICCTPCT:2 in the last 72 hours  Radiological Exams on Admission: Dg  Chest 2 View  05/25/2011  *RADIOLOGY REPORT*  Clinical Data: Fever.  Vomiting.  Diarrhea. Hyperthyroidism.  CHEST - 2 VIEW  Comparison:  05/21/2011  Findings:  The heart size and mediastinal contours are within normal limits.  Both lungs are clear.  The visualized skeletal structures are unremarkable.  IMPRESSION: No active cardiopulmonary disease.  Original Report Authenticated By: Danae Orleans, M.D.   Dg Chest 2 View  05/21/2011  *RADIOLOGY REPORT*  Clinical Data: Cough, congestion.  CHEST - 2 VIEW  Comparison: None.  Findings: Heart and mediastinal contours are within normal limits. No focal opacities or effusions.  No  acute bony abnormality.  IMPRESSION: No active cardiopulmonary disease.  Original Report Authenticated By: Cyndie Chime, M.D.   US Soft Tissue Head/neck  05/22/2011  *RADIOLOGY REPORT*  Clinical Data: Hyperthyroidism  THYROID ULTRASOUND  Technique: Ultrasound examination of the thyroid gland and adjacent soft tissues was performed.  Comparison:  None  Findings:  Right thyroid lobe:  33 x 36 x 75 mm, inhomogeneous Left thyroid lobe:  29 x 34 x 68 mm Isthmus:  9.3 mm in thickness  Focal nodules:  None  Lymphadenopathy:  None visualized.  IMPRESSION:  Thyromegaly without discrete nodule or other focal lesion.  Original Report Authenticated By: Osa Craver, M.D.   Ct Abdomen Pelvis W Contrast  05/21/2011  *RADIOLOGY REPORT*  Clinical Data: Abdominal pain.  Weight loss.  Nausea and vomiting. Diarrhea.  CT ABDOMEN AND PELVIS WITH CONTRAST  Technique:  Multidetector CT imaging of the abdomen and pelvis was performed following the standard protocol during bolus administration of intravenous contrast.  Contrast: OMNIPAQUE IOHEXOL 300 MG/ML  SOLN  Comparison: None.  Findings: Contrast opacity is poor for some reason.  The technologist states that the scan proceeded normally.  Lung bases are clear.  No pleural or pericardial fluid.  The liver has a normal appearance without focal lesions or biliary ductal dilatation. The gallbladder appears collapsed but unremarkable.  The spleen is normal.  The pancreas is normal.  The adrenal glands are normal.  The kidneys are normal.  The aorta and IVC are normal.  No retroperitoneal mass or adenopathy.  No free intraperitoneal fluid or air.  No bowel pathology is seen.  The appendix is normal.  Bladder, prostate gland and seminal vesicles are unremarkable.  No significant bony finding.  IMPRESSION: Poor contrast opacity, reason unknown.  No pathologic finding.  Original Report Authenticated By: Thomasenia Sales, M.D.    Assessment Thyrotoxicosis in young male  recently diagnosed of hyperthyroidism. Likely, this was athyroid storm, surprising as he seemed to be tolerating interventions well before discharge. Wonder if there was a trigger, like infection. He may have just had a delayed reaction. Plan .Fever- likely due to thyrotoxicosis. Follow septic work up. Will cover for possible uti/gi infection. .Hyperthyroidism- not yet controlled. Will increase methimazole dose, add short course of steroids, continue atenolol, ivf. Monitor in telemetry. .Microcytic anemia- stool hemoccult. Consider gi-?ibd in setting of hyperthyroidism. Marland KitchenUTI (lower urinary tract infection)- will add cipro. Follow UC. Dvt/gi prophylaxis.  Alia Parsley [phone] 05/25/2011, 8:09 AM

## 2011-05-25 NOTE — Progress Notes (Signed)
05/25/11 0925  OTHER  CSW Follow Up Status No follow-up needed   Dionne Milo MSW [facility] Emergency Dept. Weekend/Social Worker [phone]

## 2011-05-25 NOTE — ED Notes (Signed)
Stated was admitted to hosp for hyperthyroidism  and dc home yesterday, fever went up and waited for while , took tylenol and fever did not go , temp 102 at home .

## 2011-05-26 LAB — STOOL CULTURE

## 2011-05-26 LAB — CBC
HCT: 35.6 % — ABNORMAL LOW (ref 39.0–52.0)
Hemoglobin: 12 g/dL — ABNORMAL LOW (ref 13.0–17.0)
MCV: 74.2 fL — ABNORMAL LOW (ref 78.0–100.0)
WBC: 15.7 10*3/uL — ABNORMAL HIGH (ref 4.0–10.5)

## 2011-05-26 LAB — COMPREHENSIVE METABOLIC PANEL
ALT: 136 U/L — ABNORMAL HIGH (ref 0–53)
Albumin: 3.3 g/dL — ABNORMAL LOW (ref 3.5–5.2)
BUN: 22 mg/dL (ref 6–23)
Calcium: 10 mg/dL (ref 8.4–10.5)
GFR calc Af Amer: >90 mL/min (ref >90)
Glucose, Bld: 110 mg/dL — ABNORMAL HIGH (ref 70–99)
Sodium: 142 mEq/L (ref 135–145)
Total Protein: 6.8 g/dL (ref 6.0–8.3)

## 2011-05-26 LAB — URINE CULTURE
Colony Count: NO GROWTH
Culture  Setup Time: 201304142302
Culture: NO GROWTH

## 2011-05-26 LAB — UIFE/LIGHT CHAINS/TP QN, 24-HR UR
Alpha 1, Urine: NOT DETECTED
Free Kappa Lt Chains,Ur: 1.11 mg/dL (ref 0.14–2.42)
Free Lambda Lt Chains,Ur: 0.05 mg/dL (ref 0.02–0.67)
Gamma Globulin, Urine: NOT DETECTED

## 2011-05-26 LAB — HIV-1 RNA, QUALITATIVE, TMA: HIV-1 RNA, Qualitative, TMA: NOT DETECTED

## 2011-05-26 MED ORDER — PROPYLTHIOURACIL 50 MG PO TABS
100.0000 mg | ORAL_TABLET | Freq: Three times a day (TID) | ORAL | Status: DC
  Administered 2011-05-26 – 2011-05-27 (×2): 100 mg via ORAL
  Filled 2011-05-26 (×6): qty 2

## 2011-05-26 MED ORDER — POTASSIUM IODIDE 1 GM/ML PO SOLN
250.0000 mg | Freq: Three times a day (TID) | ORAL | Status: DC
  Filled 2011-05-26 (×4): qty 30

## 2011-05-26 MED ORDER — PREDNISONE 20 MG PO TABS
40.0000 mg | ORAL_TABLET | Freq: Every day | ORAL | Status: DC
  Administered 2011-05-27: 40 mg via ORAL
  Filled 2011-05-26 (×2): qty 2

## 2011-05-26 NOTE — Progress Notes (Signed)
Patient ID: Greg Harrison, male   DOB: [birth year], 31 y.o.   MRN: [id number] Subjective:     CC: Fever and vomiting     1. Hyperthyroidism, new.         HPI:  Hyperthyroidism:  Hyperthyroidism diagnosed in April 2013 when the patient was admitted to the hospital with symptoms of fever and vomiting.  The complaints are reported by the patient are: palpitations which are experienced mostly with activity, shakiness of the body and not of the hands, feeling excessively warm and sweaty, and tiredness. The patient initially had symptoms when he would be playing basketball and he would get excessively hot and sweaty as well as tired. He would also have presents of nausea and periodic diarrhea. The patient thought he was having flulike symptoms and did not have this evaluated. He was continuing to have the symptoms as well as some loose stools daily 2 or 3 times including at night. However he would be having a fairly normal appetite He has lost 60 lbs since these symptoms started. He continued to try need more to gain the weight back but was not able to. He presented to the hospital where he was found to have severe hyperthyroidism as well as hypercalcemia  The current thyroid status is persistent hyperthyroidism .  Studies that the patient has had include a Free T4 of 11 . The last TSH was 0.01 .  The treatment regimen has been methimazole which was started on Friday .  The patient denies heat intolerance , neck swelling , tremor .        ROS:  HEENT:  no headaches.  CARDIOLOGY:  no High blood pressure.  RESPIRATORY:  no DOE (dyspnea on exertion).  GASTROENTEROLOGY:  no he has had episodes of vomiting and also fairly regular symptoms of mild diarrhea for the last 2 months  ENDOCRINOLOGY:  no Diabetes.         Family History:  Negative for thyroid disease.       Social History: Nonsmoker         Medications: As listed on  chart        Allergies: None       Objective:     Examination:  Problems focused exam:  General Appearance: pleasant, not anxious or hyperkinetic. Currently he is afebrile. Pulse 84 regular  Eyes: No proptosis or eyelid swelling. No lid lag or stare .  Neck: The thyroid is enlarged 3 times normal, smooth, non-tender and diffuse. This is mostly laterally and there is no palpable thrill or bruit There is no lymphadenopathy .  Heart: normal S1 and S2, no murmurs .  Lungs: breath sounds are clear bilaterally .   Abdomen: Hepatosplenomegaly, distention or tenderness  Extremities: hands are not unusually warm or moist No ankle edema .  Neurological:  REFLEXES: at biceps and ankles are hyperactive 2 no +.  TREMORS: No tremors are present..      ASSESSMENT/PLAN: The patient clearly has severe hyperthyroidism but not convinced that he has had a thyroid storm. He does have some atypical presentations with fever and episodes of vomiting but did not have any mental status changes or extreme tachycardia to indicate thyroid storm. However he has responded to steroids. Liver function abnormalities: Although these are acute this is unlikely that he would have these with only 2 or 3 doses of methimazole. Increased liver functions may be related to his acute febrile illness along with hyperthyroidism. However will change his treatment to Propyl-Thyracil 100  mg 3 times a day and continue to monitor. 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 We'll also add steroids for a week which will also hopefully prevent the rise in his calcium level for febrile episodes. His heart rate should be controlled with beta blockers. Patient will be seen in followup in about a week. Discussed that long-term he will need I-131 treatment but this probably can be done for at  least 2 months because of his iodine exposure

## 2011-05-26 NOTE — Progress Notes (Signed)
Utilization Review Completed.Greg Harrison T4/15/2013   

## 2011-05-26 NOTE — Progress Notes (Signed)
Have spoken with Dr Lucianne Muss who will graciously see patient in consult later today- patient seems to be reacting adversely to Methimazole/transaminitis/fever?). I have discontinued methimazole till dr Lucianne Muss sees him, in case he is reacting adversely. Surprisingly, Mr Wyne did not have fever during his hospital stay last week and developed fever of 103F once he got home. Could this be the drug reaction or thyrotoxicosis, which seemed under control. He dose not seem to have infection. He feels ok today but he has some hiccups.    1. Hyperthyroidism   2. Fever     History reviewed. No pertinent past medical history. Current Facility-Administered Medications  Medication Dose Route Frequency Provider Last Rate Last Dose  . 0.9 %  sodium chloride infusion   Intravenous Continuous Lathyn Griggs, MD 100 mL/hr at 05/26/11 0823    . atenolol (TENORMIN) tablet 25 mg  25 mg Oral BID Doug Bucklin, MD   25 mg at 05/26/11 0948  . HYDROcodone-acetaminophen (NORCO) 5-325 MG per tablet 1-2 tablet  1-2 tablet Oral Q4H PRN Wrenley Sayed, MD      . morphine 2 MG/ML injection 2 mg  2 mg Intravenous Q4H PRN Truitt Cruey, MD      . ondansetron (ZOFRAN) tablet 4 mg  4 mg Oral Q6H PRN Rossi Burdo, MD       Or  . ondansetron (ZOFRAN) injection 4 mg  4 mg Intravenous Q6H PRN Criag Wicklund, MD      . pantoprazole (PROTONIX) EC tablet 40 mg  40 mg Oral Q1200 Arpi Diebold, MD   40 mg at 05/26/11 1152  . predniSONE (DELTASONE) tablet 40 mg  40 mg Oral Q breakfast Edilson Vital, MD      . sodium chloride 0.9 % injection 3 mL  3 mL Intravenous Q12H Gohan Collister, MD      . zolpidem (AMBIEN) tablet 5 mg  5 mg Oral QHS PRN Keairra Bardon, MD      . DISCONTD: acetaminophen (TYLENOL) suppository 650 mg  650 mg Rectal Q6H PRN Ghazi Rumpf, MD      . DISCONTD: acetaminophen (TYLENOL) tablet 650 mg  650 mg Oral Q6H PRN Hermenia Fritcher, MD      . DISCONTD: ciprofloxacin (CIPRO) IVPB 400 mg  400 mg Intravenous Q12H Kiki Bivens, MD    400 mg at 05/26/11 0947  . DISCONTD: hydrocortisone sodium succinate (SOLU-CORTEF) 100 mg/2 mL injection 50 mg  50 mg Intravenous Q8H Eretria Manternach, MD   50 mg at 05/26/11 0824  . DISCONTD: methimazole (TAPAZOLE) tablet 20 mg  20 mg Oral TID Conley Canal, MD   20 mg at 05/26/11 0948  . DISCONTD: metroNIDAZOLE (FLAGYL) IVPB 500 mg  500 mg Intravenous Q8H Pinky Ravan, MD   500 mg at 05/26/11 0948   No Known Allergies Active Problems:  Fever  Hyperthyroidism  Microcytic anemia  UTI (lower urinary tract infection)   Vital signs in last 24 hours: Temp:  [98.5 F (36.9 C)-99.3 F (37.4 C)] 98.5 F (36.9 C) (04/15 1400) Pulse Rate:  [73-89] 86  (04/15 1400) Resp:  [18] 18  (04/15 1400) BP: (124-148)/(63-82) 128/65 mmHg (04/15 1400) SpO2:  [98 %-100 %] 98 % (04/15 1400) Weight change:  Last BM Date: 05/26/11  Intake/Output from previous day: 04/14 0701 - 04/15 0700 In: 240 [P.O.:240] Out: -  Intake/Output this shift:    Lab Results:  Basename 05/26/11 1051 05/25/11 0935  WBC 15.7* 10.0  HGB 12.0* 11.4*  HCT 35.6* 33.7*  PLT 196  195   BMET  Basename 05/26/11 0958 05/25/11 0935 05/25/11 0524 05/25/11 0511  NA 142 -- 135 --  K 3.5 -- 3.8 --  CL 104 -- 99 --  CO2 25 -- -- 26  GLUCOSE 110* -- 92 --  BUN 22 -- 14 --  CREATININE 0.68 0.79 -- --  CALCIUM 10.0 -- -- 11.7*    Studies/Results: Dg Chest 2 View  05/25/2011  *RADIOLOGY REPORT*  Clinical Data: Fever.  Vomiting.  Diarrhea. Hyperthyroidism.  CHEST - 2 VIEW  Comparison:  05/21/2011  Findings:  The heart size and mediastinal contours are within normal limits.  Both lungs are clear.  The visualized skeletal structures are unremarkable.  IMPRESSION: No active cardiopulmonary disease.  Original Report Authenticated By: Danae Orleans, M.D.    Medications: I have reviewed the patient's current medications.   Physical exam GENERAL- alert HEAD- normal atraumatic, no neck masses, normal thyroid, no jvd RESPIRATORY-  appears well, vitals normal, no respiratory distress, acyanotic, normal RR, ear and throat exam is normal, neck free of mass or lymphadenopathy, chest clear, no wheezing, crepitations, rhonchi, normal symmetric air entry CVS- regular rate and rhythm, S1, S2 normal, no murmur, click, rub or gallop ABDOMEN- abdomen is soft without significant tenderness, masses, organomegaly or guarding NEURO- Grossly normal EXTREMITIES- extremities normal, atraumatic, no cyanosis or edema  Plan .Fever- Maybe a drug reaction. No strong evidence for infection. Monitor off abx, off methimazole. Marland KitchenHyperthyroidism- concern for adverse reaction to methimazole. Continue short course of steroids, continue atenolol, ivf. Monitor in telemetry. Dr Lucianne Muss will see patient later today. .Microcytic anemia- stool hemoccult. Will consult Dr Loreta Ave. Transaminitis- ?cause(seems meds related. Obtain ultrasound abdomen. D/c flgyl/cipro/methimazole. Leukocytosis- seems steroid induced. Monitor. Follow   Dvt/gi prophylaxis.         Kristoph Sattler 05/26/2011 4:08 PM Pager: [phone].

## 2011-05-27 ENCOUNTER — Inpatient Hospital Stay (HOSPITAL_COMMUNITY): Payer: No Typology Code available for payment source

## 2011-05-27 LAB — HEPATIC FUNCTION PANEL
ALT: 94 U/L — ABNORMAL HIGH (ref 0–53)
AST: 53 U/L — ABNORMAL HIGH (ref 0–37)
Albumin: 2.7 g/dL — ABNORMAL LOW (ref 3.5–5.2)
Alkaline Phosphatase: 89 U/L (ref 39–117)
Total Bilirubin: 0.3 mg/dL (ref 0.3–1.2)
Total Protein: 5.6 g/dL — ABNORMAL LOW (ref 6.0–8.3)

## 2011-05-27 LAB — PROTIME-INR: Prothrombin Time: 16.9 seconds — ABNORMAL HIGH (ref 11.6–15.2)

## 2011-05-27 LAB — CBC
Hemoglobin: 10.2 g/dL — ABNORMAL LOW (ref 13.0–17.0)
MCH: 24.8 pg — ABNORMAL LOW (ref 26.0–34.0)
MCV: 74.2 fL — ABNORMAL LOW (ref 78.0–100.0)
RBC: 4.11 MIL/uL — ABNORMAL LOW (ref 4.22–5.81)

## 2011-05-27 LAB — BASIC METABOLIC PANEL
BUN: 14 mg/dL (ref 6–23)
CO2: 24 mEq/L (ref 19–32)
Calcium: 8.4 mg/dL (ref 8.4–10.5)
Creatinine, Ser: 0.75 mg/dL (ref 0.50–1.35)
Glucose, Bld: 86 mg/dL (ref 70–99)

## 2011-05-27 MED ORDER — PROPYLTHIOURACIL 50 MG PO TABS: 100.0000 mg | ORAL_TABLET | Freq: Three times a day (TID) | ORAL | Status: DC

## 2011-05-27 MED ORDER — POTASSIUM CHLORIDE 20 MEQ/15ML (10%) PO LIQD
40.0000 meq | Freq: Once | ORAL | Status: AC
  Administered 2011-05-27: 40 meq via ORAL
  Filled 2011-05-27: qty 30

## 2011-05-27 MED ORDER — PREDNISONE 10 MG PO TABS: ORAL_TABLET | ORAL | Status: DC

## 2011-05-27 MED ORDER — IODINE STRONG (LUGOLS) 5 % PO SOLN
0.2000 mL | Freq: Three times a day (TID) | ORAL | Status: DC
  Administered 2011-05-27: 1 mL via ORAL
  Filled 2011-05-27 (×3): qty 0.2

## 2011-05-27 MED ORDER — IODINE STRONG (LUGOLS) 5 % PO SOLN: 0.2000 mL | Freq: Three times a day (TID) | ORAL | Status: DC

## 2011-05-27 NOTE — Discharge Summary (Signed)
DISCHARGE SUMMARY  Greg Harrison  Greg#: [id number]  DOB:[birth year]  Date of Admission: 05/25/2011 Date of Discharge: 05/27/2011  Attending Physician:Madlyn Crosby  Patient's WUJ:WJXBJYN,WGNFAOZH, MD, MD  Consults: -Dr Reather Littler  Discharge Diagnoses: Present on Admission:  .Fever .Hyperthyroidism .Microcytic anemia .UTI (lower urinary tract infection)   Hospital Course: Greg Harrison was readmitted with fever, and chills a day after discharge after management for hyperthyroidism. He likely had an adverse reaction to methimazole. He developed transaminitis, which seems to be resolving after methimazole was discontinued. Dr Lucianne Muss graciously helped with patient's management. He has placed Greg Harrison on PTU, and Lugol's solution, as well as a week of steroids. Greg Harrison feels better. Greg Harrison will follow with Dr Lucianne Muss in 1 week. He is discharged in 1 week. He needs a follow up of his LFTs, and a thyroid uptake scan in 6 weeks. Of note is that his thyroid peroxidase antibody was elevated at 391. His fever does not seem to be infection related.   Medication List  As of 05/27/2011  2:23 PM   STOP taking these medications         methimazole 20 MG tablet         TAKE these medications         atenolol 50 MG tablet   Commonly known as: TENORMIN   Take 1 tablet (50 mg total) by mouth daily.      Iodine Strong (Lugols) 5 % Soln solution   Take 0.2 mLs by mouth 3 (three) times daily.      MULTIVITAMIN PO   Take 1 tablet by mouth daily.      predniSONE 10 MG tablet   Commonly known as: DELTASONE   Take 4 tablets daily for 2 days, then 3 tablets daily for 2 days, then 2 tablets daily for 2 days, then 1 tablet daily for 2 days.      propylthiouracil 50 MG tablet   Commonly known as: PTU   Take 2 tablets (100 mg total) by mouth every 8 (eight) hours.             Day of Discharge BP 141/75  Pulse 88  Temp(Src) 100.9 F (38.3 C) (Oral)  Resp 18  Ht 6\' 6"  (1.981 m)  Wt 97.523 kg  (215 lb)  BMI 24.85 kg/m2  SpO2 98%  Physical Exam: At baseline.  Results for orders placed during the hospital encounter of 05/25/11 (from the past 24 hour(s))  CBC     Status: Abnormal   Collection Time   05/27/11  6:03 AM      Component Value Range   WBC 8.7  4.0 - 10.5 (K/uL)   RBC 4.11 (*) 4.22 - 5.81 (MIL/uL)   Hemoglobin 10.2 (*) 13.0 - 17.0 (g/dL)   HCT 08.6 (*) 57.8 - 52.0 (%)   MCV 74.2 (*) 78.0 - 100.0 (fL)   MCH 24.8 (*) 26.0 - 34.0 (pg)   MCHC 33.4  30.0 - 36.0 (g/dL)   RDW 46.9  62.9 - 52.8 (%)   Platelets 228  150 - 400 (K/uL)  BASIC METABOLIC PANEL     Status: Abnormal   Collection Time   05/27/11  6:03 AM      Component Value Range   Sodium 138  135 - 145 (mEq/L)   Potassium 3.3 (*) 3.5 - 5.1 (mEq/L)   Chloride 104  96 - 112 (mEq/L)   CO2 24  19 - 32 (mEq/L)   Glucose, Bld 86  70 -  99 (mg/dL)   BUN 14  6 - 23 (mg/dL)   Creatinine, Ser 1.61  0.50 - 1.35 (mg/dL)   Calcium 8.4  8.4 - 09.6 (mg/dL)   GFR calc non Af Amer >90  >90 (mL/min)   GFR calc Af Amer >90  >90 (mL/min)  MAGNESIUM     Status: Normal   Collection Time   05/27/11  6:03 AM      Component Value Range   Magnesium 1.9  1.5 - 2.5 (mg/dL)  PROTIME-INR     Status: Abnormal   Collection Time   05/27/11  6:03 AM      Component Value Range   Prothrombin Time 16.9 (*) 11.6 - 15.2 (seconds)   INR 1.35  0.00 - 1.49   HEPATIC FUNCTION PANEL     Status: Abnormal   Collection Time   05/27/11  6:03 AM      Component Value Range   Total Protein 5.6 (*) 6.0 - 8.3 (g/dL)   Albumin 2.7 (*) 3.5 - 5.2 (g/dL)   AST 53 (*) 0 - 37 (U/L)   ALT 94 (*) 0 - 53 (U/L)   Alkaline Phosphatase 89  39 - 117 (U/L)   Total Bilirubin 0.3  0.3 - 1.2 (mg/dL)   Bilirubin, Direct 0.1  0.0 - 0.3 (mg/dL)   Indirect Bilirubin 0.2 (*) 0.3 - 0.9 (mg/dL)    Disposition: home today.   Follow-up Appts: Discharge Orders    Future Orders Please Complete By Expires   Diet - low sodium heart healthy      Increase activity  slowly         Follow-up Information    Follow up with [facility], MD in 1 week.   Contact information:   [address] [facility] [place] [address] [phone]          Tests Needing Follow-up: LFTs, thyroid uptake scan.  Time spent in discharge (includes decision making & examination of pt): 15 minutes  Signed: Kaivon Livesey 05/27/2011, 2:23 PM

## 2011-05-27 NOTE — Progress Notes (Signed)
Pt given all discharge information and RX ,FU care pt understanding all orders

## 2011-05-27 NOTE — Progress Notes (Signed)
Subjective: Fever  Objective: Vital signs in last 24 hours: Temp:  [98.5 F (36.9 C)-100.9 F (38.3 C)] 100.9 F (38.3 C) (04/16 0500) Pulse Rate:  [86-94] 88  (04/16 0500) Resp:  [18] 18  (04/16 0500) BP: (128-141)/(61-75) 141/75 mmHg (04/16 0500) SpO2:  [98 %-100 %] 98 % (04/16 0500) Weight change:  Last BM Date: 05/26/11  Intake/Output from previous day:   Intake/Output this shift:    General appearance: alert and no distress No tremor Heart rate 88  Lab Results:  Basename 05/26/11 1051 05/25/11 0935  WBC 15.7* 10.0  HGB 12.0* 11.4*  HCT 35.6* 33.7*  PLT 196 195   BMET  Basename 05/26/11 0958 05/25/11 0935 05/25/11 0524 05/25/11 0511  NA 142 -- 135 --  K 3.5 -- 3.8 --  CL 104 -- 99 --  CO2 25 -- -- 26  GLUCOSE 110* -- 92 --  BUN 22 -- 14 --  CREATININE 0.68 0.79 -- --  CALCIUM 10.0 -- -- 11.7*    Studies/Results: No results found.LFT pending  Medications: I have reviewed the patient's current medications.  Assessment/Plan: Continue PTU; prednisone taper over 1 week, SSKI, f/u 1 week in office. Hopefully LFT will improve with control of fever and thyrotoxicosis  LOS: 2 days   Jaser Fullen 05/27/2011, 8:05 AM

## 2011-05-28 LAB — CULTURE, BLOOD (ROUTINE X 2)
Culture  Setup Time: 201304110820
Culture: NO GROWTH

## 2011-05-31 LAB — CULTURE, BLOOD (ROUTINE X 2)
Culture  Setup Time: 201304141555
Culture  Setup Time: 201304141555
Culture: NO GROWTH

## 2011-07-25 ENCOUNTER — Other Ambulatory Visit: Payer: Self-pay | Admitting: Endocrinology

## 2011-07-25 DIAGNOSIS — E0590 Thyrotoxicosis, unspecified without thyrotoxic crisis or storm: Secondary | ICD-10-CM

## 2011-08-04 ENCOUNTER — Encounter (HOSPITAL_COMMUNITY)
Admission: RE | Admit: 2011-08-04 | Discharge: 2011-08-04 | Disposition: A | Discharge status: Home / Self Care | Payer: Self-pay | Source: Ambulatory Visit | Attending: Endocrinology | Admitting: Endocrinology

## 2011-08-04 DIAGNOSIS — E0590 Thyrotoxicosis, unspecified without thyrotoxic crisis or storm: Secondary | ICD-10-CM | POA: Insufficient documentation

## 2011-08-05 ENCOUNTER — Ambulatory Visit (HOSPITAL_COMMUNITY)
Admission: RE | Admit: 2011-08-05 | Discharge: 2011-08-05 | Disposition: A | Discharge status: Home / Self Care | Payer: Self-pay | Source: Ambulatory Visit | Attending: Endocrinology | Admitting: Endocrinology

## 2011-08-05 DIAGNOSIS — E0500 Thyrotoxicosis with diffuse goiter without thyrotoxic crisis or storm: Secondary | ICD-10-CM | POA: Insufficient documentation

## 2011-08-05 MED ORDER — SODIUM IODIDE I 131 CAPSULE
9.0000 | Freq: Once | INTRAVENOUS | Status: AC | PRN
  Administered 2011-08-04: 9 via ORAL

## 2011-08-06 ENCOUNTER — Other Ambulatory Visit: Payer: Self-pay | Admitting: Endocrinology

## 2011-08-06 DIAGNOSIS — E0590 Thyrotoxicosis, unspecified without thyrotoxic crisis or storm: Secondary | ICD-10-CM

## 2011-08-22 ENCOUNTER — Ambulatory Visit (HOSPITAL_COMMUNITY)
Admission: RE | Admit: 2011-08-22 | Discharge: 2011-08-22 | Disposition: A | Discharge status: Home / Self Care | Payer: Self-pay | Source: Ambulatory Visit | Attending: Endocrinology | Admitting: Endocrinology

## 2011-08-22 DIAGNOSIS — E0500 Thyrotoxicosis with diffuse goiter without thyrotoxic crisis or storm: Secondary | ICD-10-CM | POA: Insufficient documentation

## 2011-08-22 DIAGNOSIS — E0590 Thyrotoxicosis, unspecified without thyrotoxic crisis or storm: Secondary | ICD-10-CM

## 2011-08-22 MED ORDER — SODIUM IODIDE I 131 CAPSULE
31.2000 | Freq: Once | INTRAVENOUS | Status: AC | PRN
  Administered 2011-08-22: 31.2 via ORAL

## 2013-12-11 IMAGING — CT CT ABD-PELV W/ CM
2 of 4 series · 17 of 46 positions shown, 19 images · IV contrast (water/omni  & 100ml omni 300)
Comparison: None.

CLINICAL DATA: Abdominal pain.  Weight loss.  Nausea and vomiting.
Diarrhea.

CT ABDOMEN AND PELVIS WITH CONTRAST
TECHNIQUE: Multidetector CT imaging of the abdomen and pelvis was
performed following the standard protocol during bolus
administration of intravenous contrast.
Contrast: 100mL OMNIPAQUE IOHEXOL 300 MG/ML  SOLN

[Series 2: routine abdomen · axial · 0.77mm/px · z∈[-528,-103]mm · 14 of 91 slices shown, 16 images]
[im 4/91  soft-tissue]
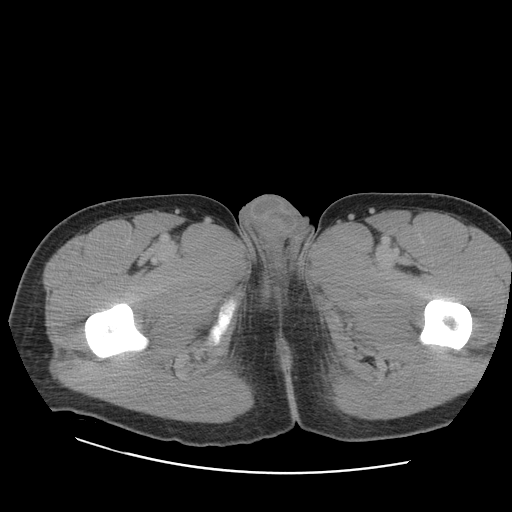
[im 4/91  bone]
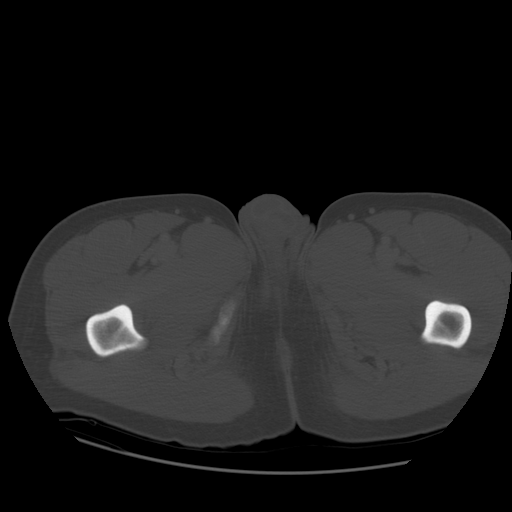
[im 12/91  soft-tissue]
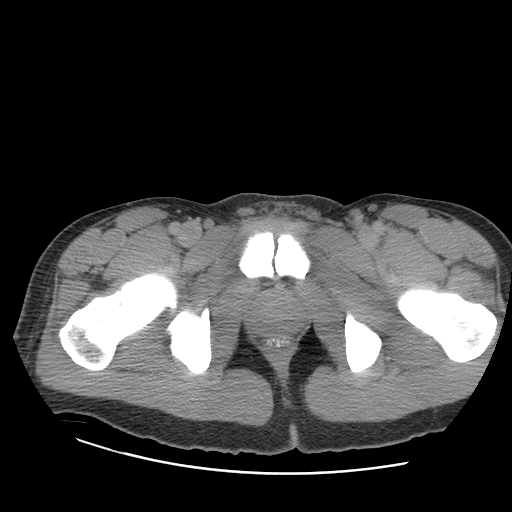
[im 19/91  soft-tissue]
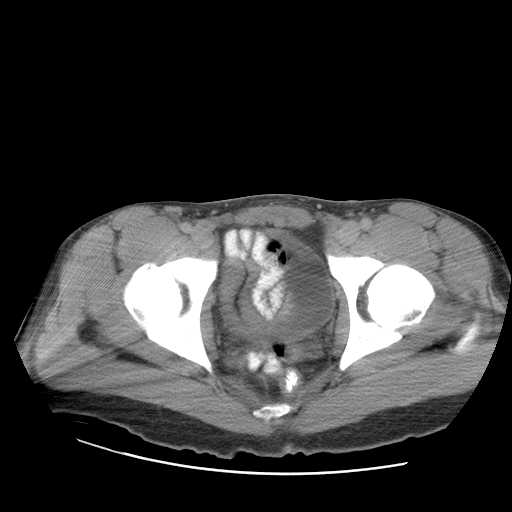
[im 23/91  soft-tissue]
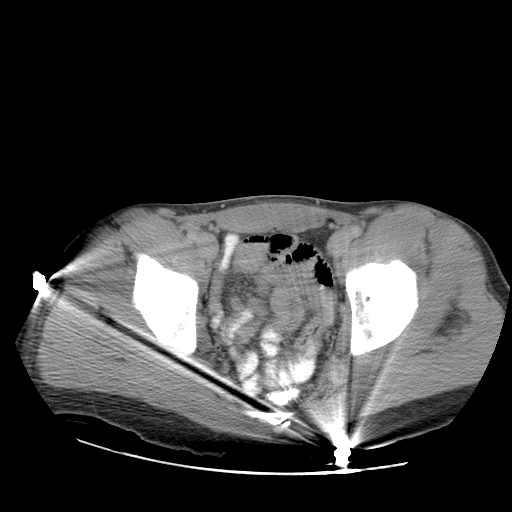
[im 31/91  soft-tissue]
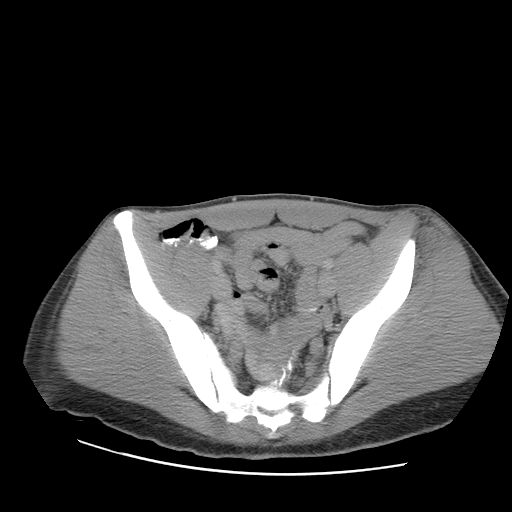
[im 38/91  soft-tissue]
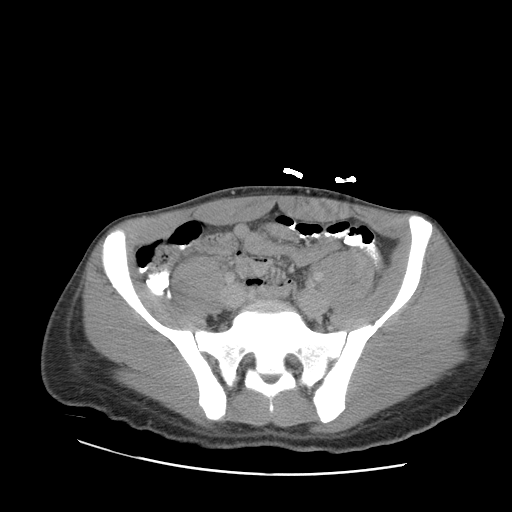
[im 42/91  soft-tissue]
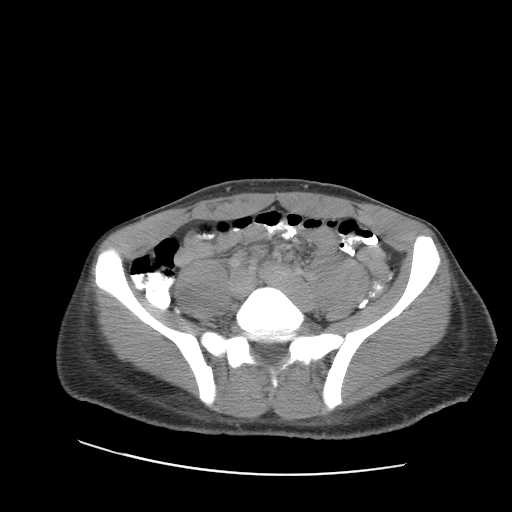
[im 49/91  soft-tissue]
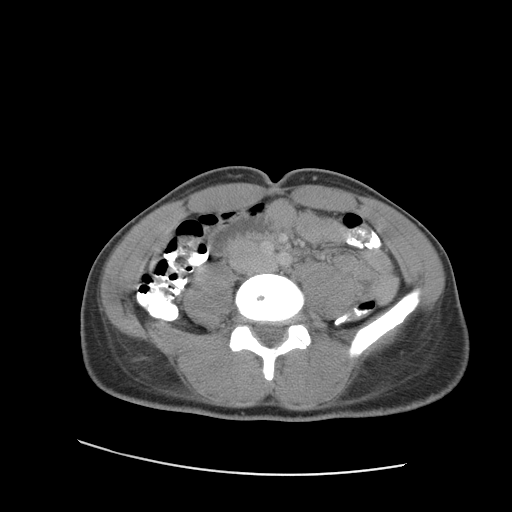
[im 53/91  soft-tissue]
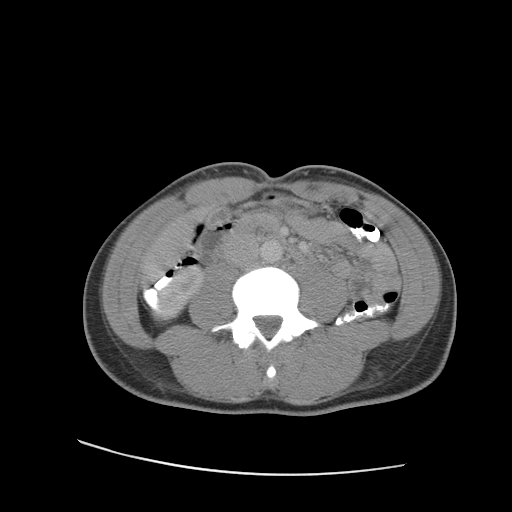
[im 53/91  bone]
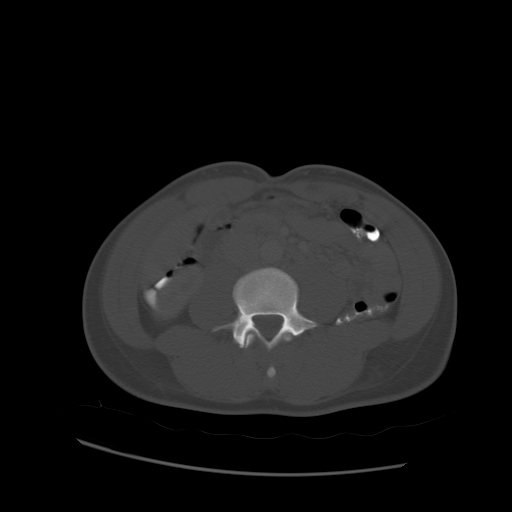
[im 61/91  soft-tissue]
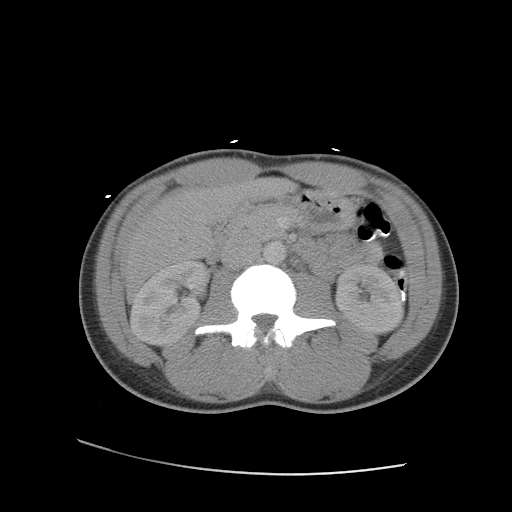
[im 68/91  soft-tissue]
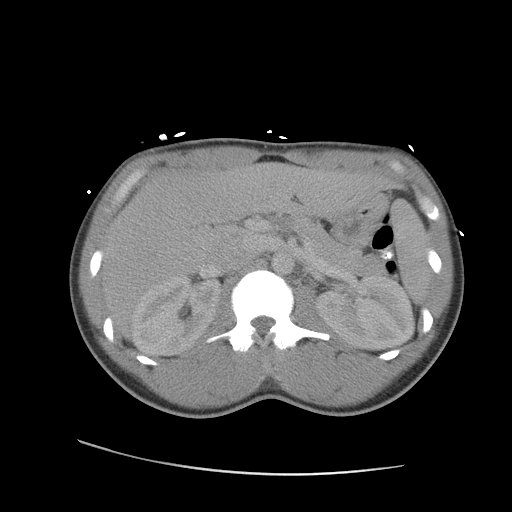
[im 72/91  soft-tissue]
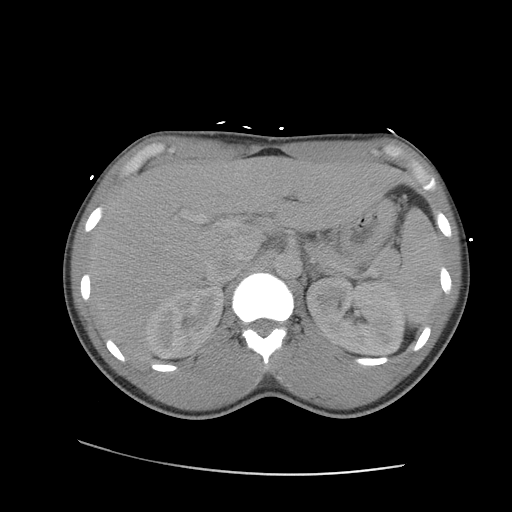
[im 79/91  soft-tissue]
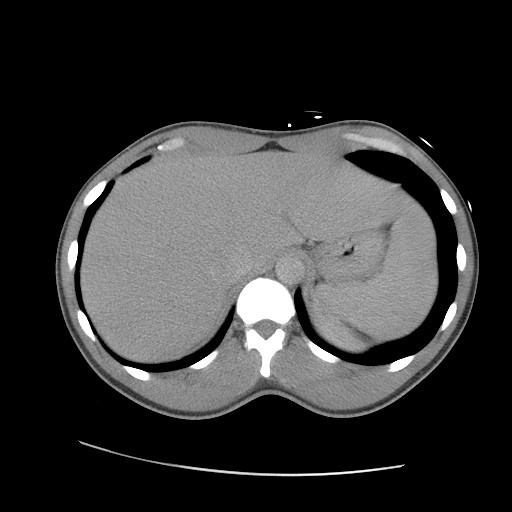
[im 87/91  soft-tissue]
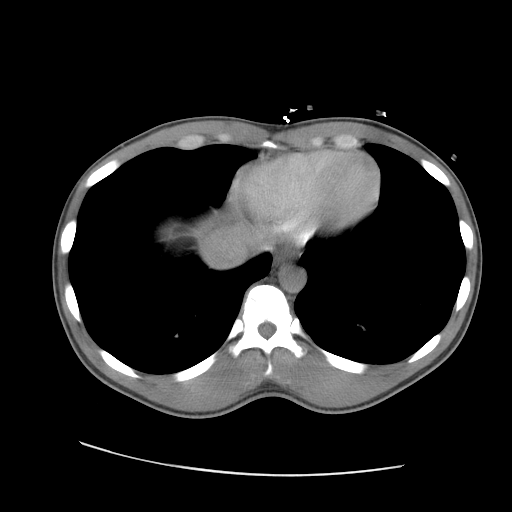

[Series 401: coronal · coronal · 0.92mm/px · 3 of 85 slices shown]
[im 29/85  soft-tissue]
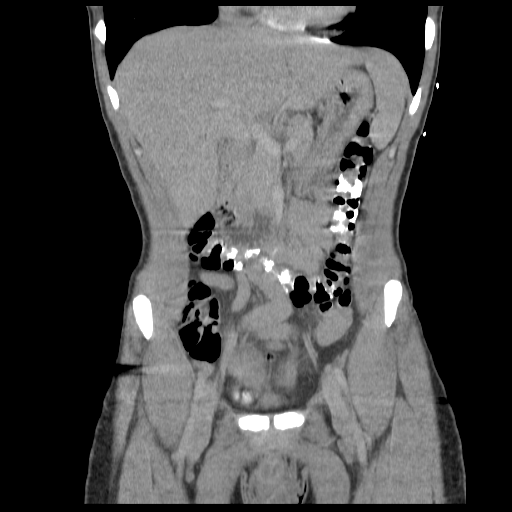
[im 38/85  soft-tissue]
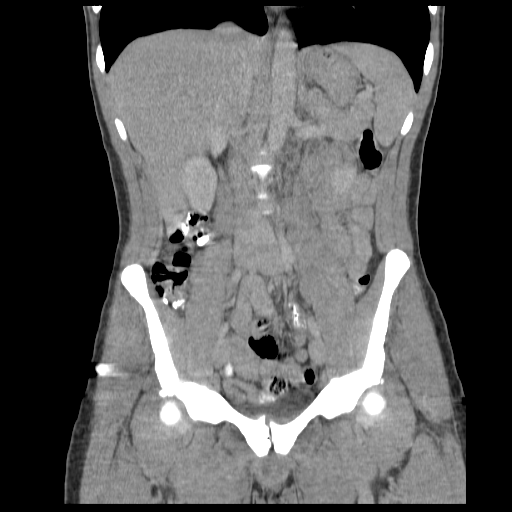
[im 47/85  soft-tissue]
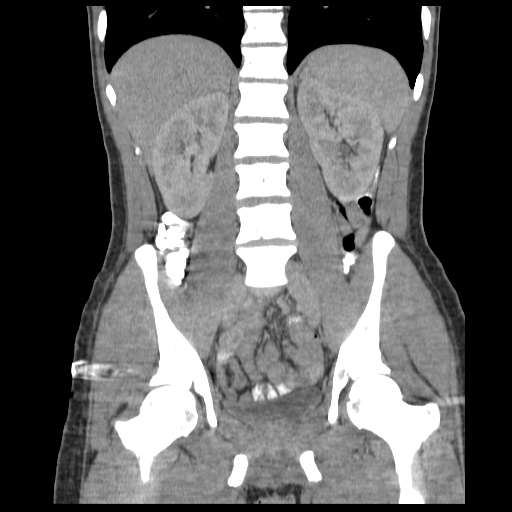

[17 of 46 positions shown; findings below may reference images not displayed]

FINDINGS: Contrast opacity is poor for some reason.  The
technologist states that the scan proceeded normally.

Lung bases are clear.  No pleural or pericardial fluid.

The liver has a normal appearance without focal lesions or biliary
ductal dilatation. The gallbladder appears collapsed but
unremarkable.  The spleen is normal.  The pancreas is normal.  The
adrenal glands are normal.  The kidneys are normal.  The aorta and
IVC are normal.  No retroperitoneal mass or adenopathy.  No free
intraperitoneal fluid or air.  No bowel pathology is seen.  The
appendix is normal.  Bladder, prostate gland and seminal vesicles
are unremarkable.  No significant bony finding.
IMPRESSION: Poor contrast opacity, reason unknown.

No pathologic finding.

## 2013-12-12 IMAGING — US US SOFT TISSUE HEAD/NECK
1 series · 14 of 25 positions shown · non-contrast
Comparison: None

CLINICAL DATA: Hyperthyroidism

THYROID ULTRASOUND
TECHNIQUE: Ultrasound examination of the thyroid gland and adjacent
soft tissues was performed.

[Series 1: us soft tissue head/neck · 0.09mm/px · 14 of 46 slices shown]
[im 1/46]
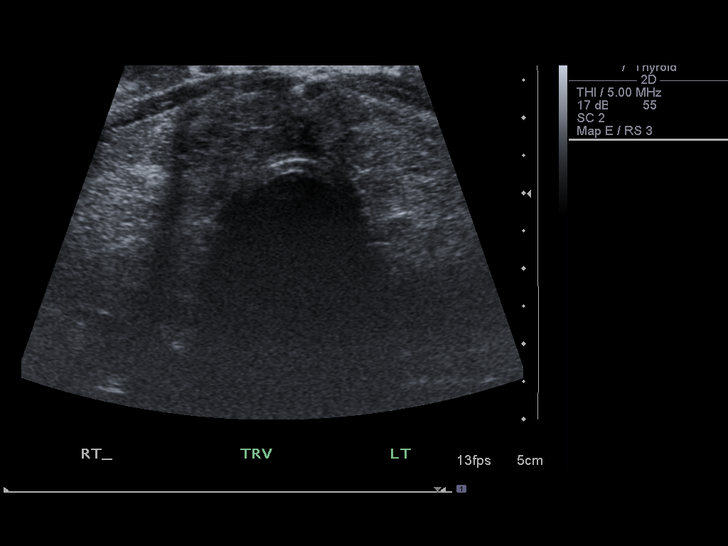
[im 4/46]
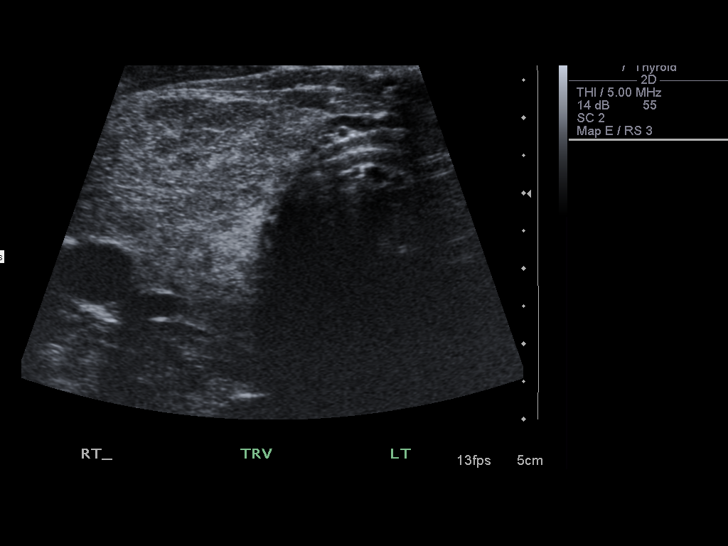
[im 8/46]
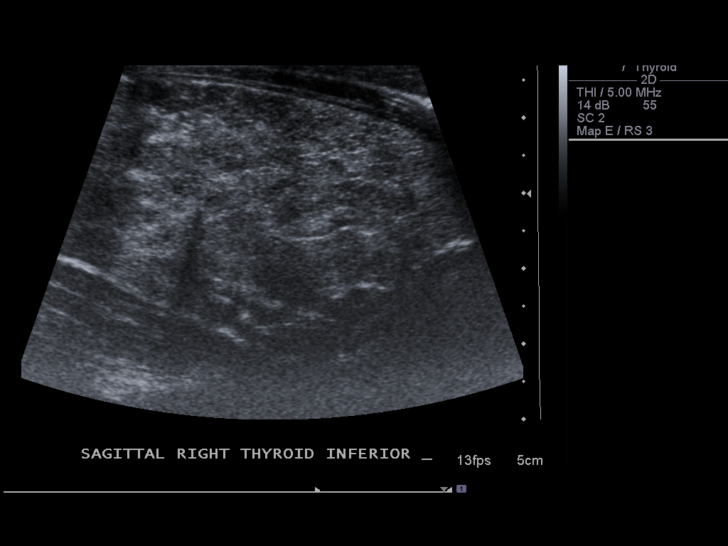
[im 12/46]
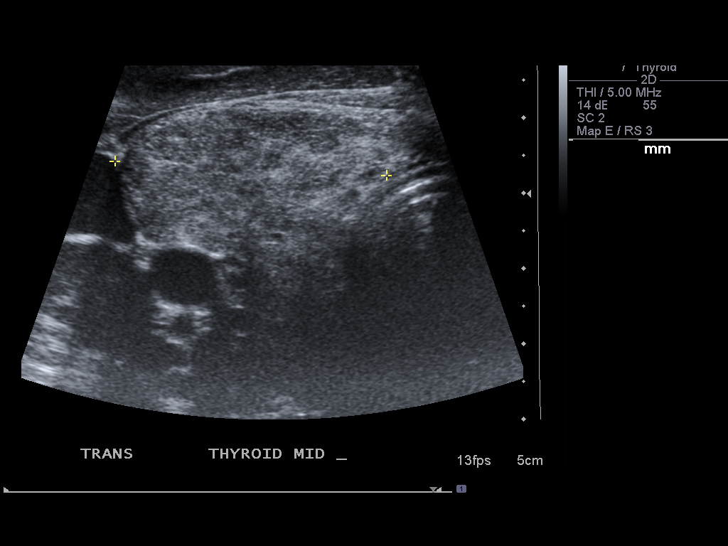
[im 16/46]
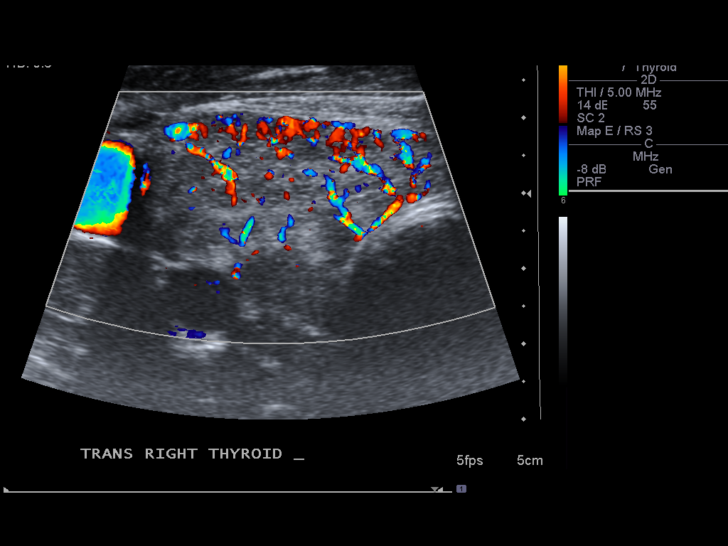
[im 17/46]
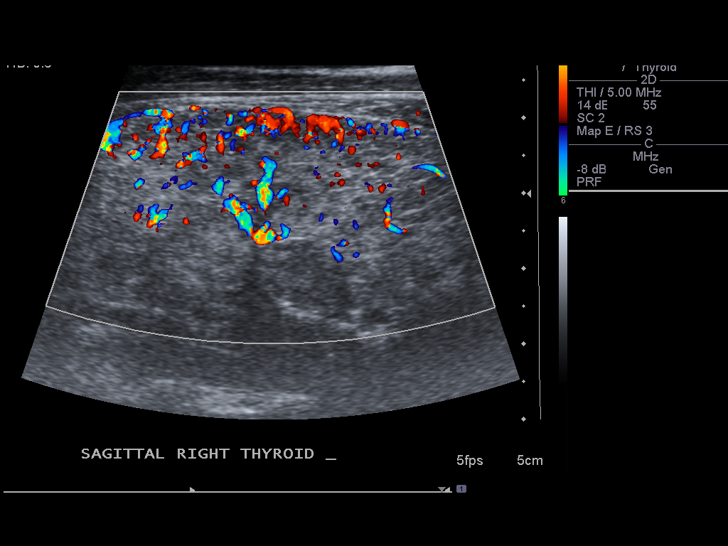
[im 21/46]
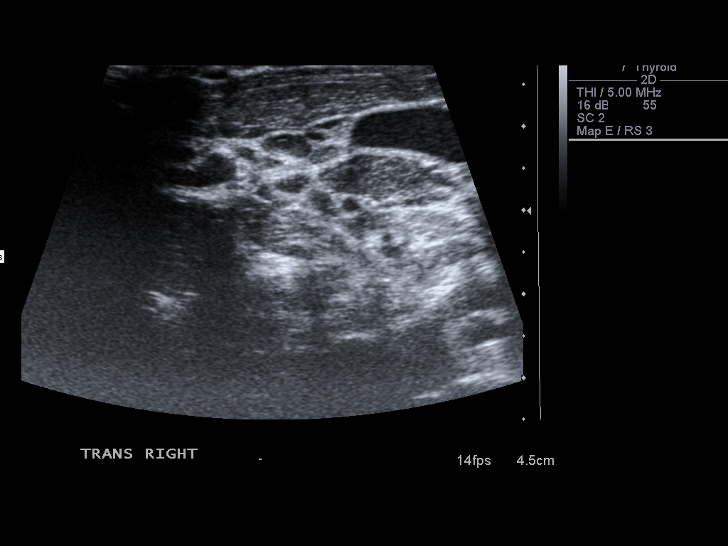
[im 25/46]
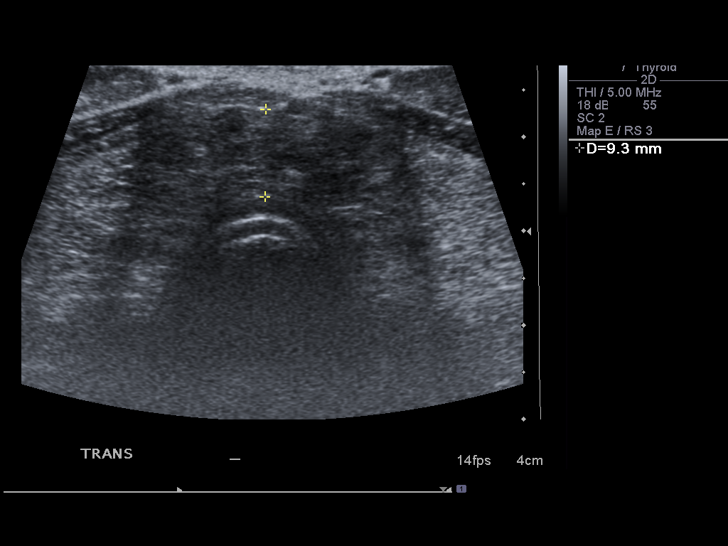
[im 29/46]
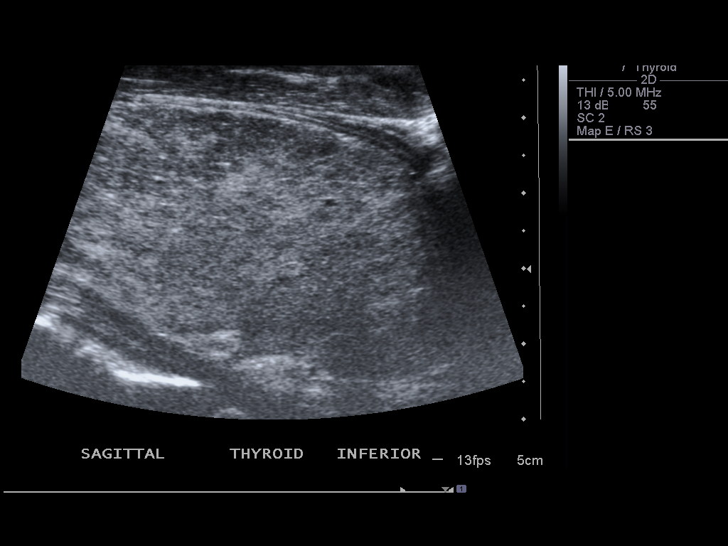
[im 31/46]
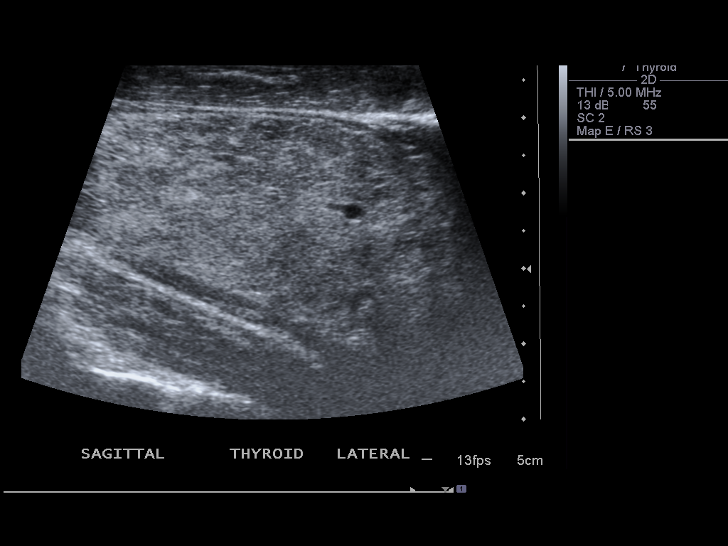
[im 34/46]
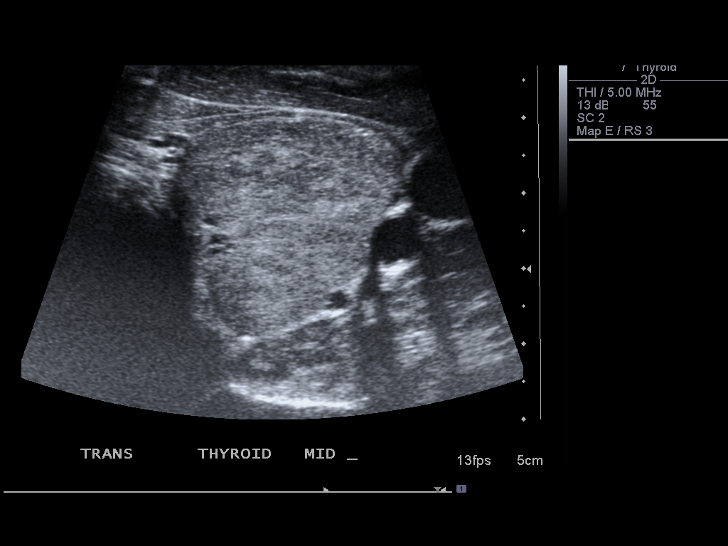
[im 38/46]
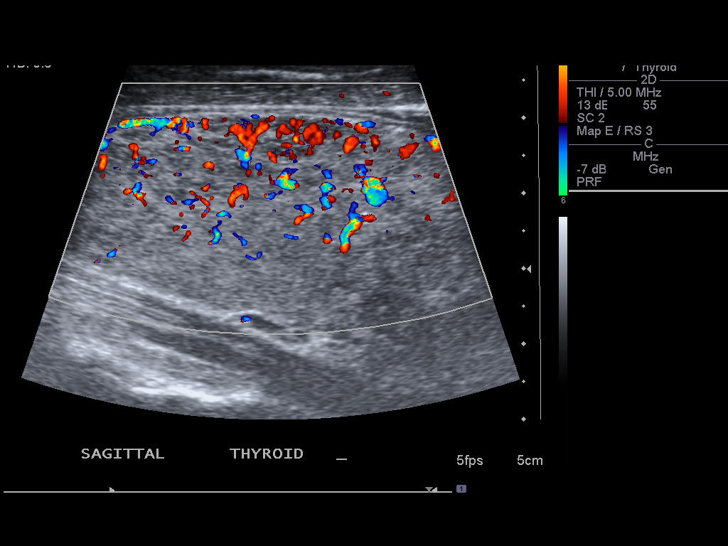
[im 42/46]
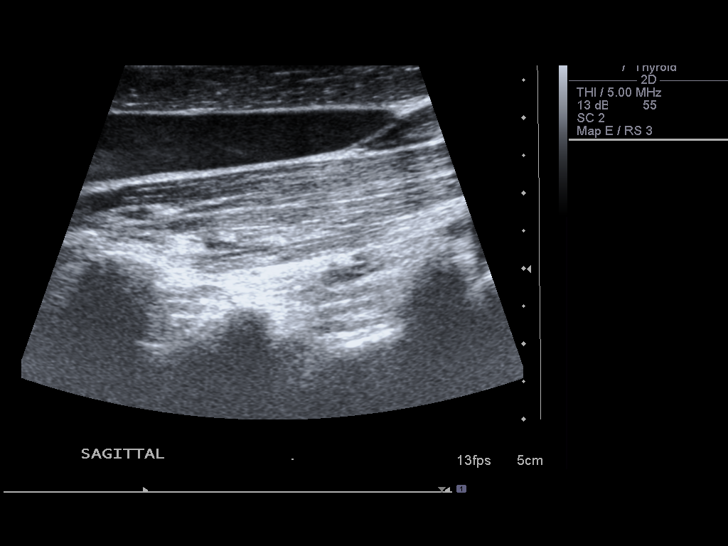
[im 46/46]
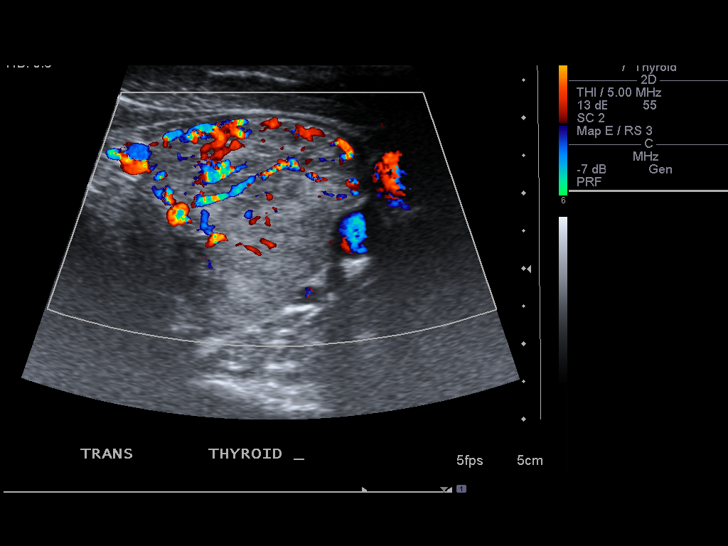

[14 of 25 positions shown; findings below may reference images not displayed]

FINDINGS: Right thyroid lobe:  33 x 36 x 75 mm, inhomogeneous
Left thyroid lobe:  29 x 34 x 68 mm
Isthmus:  9.3 mm in thickness

Focal nodules:  None

Lymphadenopathy:  None visualized.
IMPRESSION: Thyromegaly without discrete nodule or other focal lesion.

## 2015-11-09 ENCOUNTER — Encounter: Payer: Self-pay | Admitting: Family Medicine

## 2015-11-09 ENCOUNTER — Ambulatory Visit (INDEPENDENT_AMBULATORY_CARE_PROVIDER_SITE_OTHER): Payer: BLUE CROSS/BLUE SHIELD | Admitting: Family Medicine

## 2015-11-09 VITALS — BP 132/86 | HR 80 | Temp 98.1°F | Ht 78.0 in | Wt 274.0 lb

## 2015-11-09 DIAGNOSIS — E669 Obesity, unspecified: Secondary | ICD-10-CM | POA: Diagnosis not present

## 2015-11-09 DIAGNOSIS — Z0000 Encounter for general adult medical examination without abnormal findings: Secondary | ICD-10-CM | POA: Diagnosis not present

## 2015-11-09 DIAGNOSIS — R0683 Snoring: Secondary | ICD-10-CM | POA: Diagnosis not present

## 2015-11-09 DIAGNOSIS — IMO0001 Reserved for inherently not codable concepts without codable children: Secondary | ICD-10-CM

## 2015-11-09 DIAGNOSIS — Z13220 Encounter for screening for lipoid disorders: Secondary | ICD-10-CM

## 2015-11-09 DIAGNOSIS — R5382 Chronic fatigue, unspecified: Secondary | ICD-10-CM | POA: Diagnosis not present

## 2015-11-09 DIAGNOSIS — Z114 Encounter for screening for human immunodeficiency virus [HIV]: Secondary | ICD-10-CM

## 2015-11-09 DIAGNOSIS — Z131 Encounter for screening for diabetes mellitus: Secondary | ICD-10-CM

## 2015-11-09 DIAGNOSIS — Z8639 Personal history of other endocrine, nutritional and metabolic disease: Secondary | ICD-10-CM

## 2015-11-09 LAB — LIPID PANEL
Cholesterol: 225 mg/dL — ABNORMAL HIGH (ref 125–200)
HDL: 70 mg/dL (ref >=40)
LDL CALC: 118 mg/dL (ref <130)
TRIGLYCERIDES: 187 mg/dL — AB (ref <150)
Total CHOL/HDL Ratio: 3.2 Ratio (ref <=5.0)
VLDL: 37 mg/dL — AB (ref <30)

## 2015-11-09 LAB — COMPREHENSIVE METABOLIC PANEL
ALT: 15 U/L (ref 9–46)
AST: 27 U/L (ref 10–40)
Albumin: 4.4 g/dL (ref 3.6–5.1)
Alkaline Phosphatase: 44 U/L (ref 40–115)
BUN: 19 mg/dL (ref 7–25)
CHLORIDE: 106 mmol/L (ref 98–110)
CO2: 25 mmol/L (ref 20–31)
Calcium: 9.3 mg/dL (ref 8.6–10.3)
Creat: 1.35 mg/dL (ref 0.60–1.35)
GLUCOSE: 105 mg/dL — AB (ref 65–99)
POTASSIUM: 3.8 mmol/L (ref 3.5–5.3)
Sodium: 139 mmol/L (ref 135–146)
Total Bilirubin: 0.2 mg/dL (ref 0.2–1.2)
Total Protein: 6.7 g/dL (ref 6.1–8.1)

## 2015-11-09 LAB — T4, FREE: Free T4: 0.5 ng/dL — ABNORMAL LOW (ref 0.8–1.8)

## 2015-11-09 LAB — TSH: TSH: 48.33 mIU/L — ABNORMAL HIGH (ref 0.40–4.50)

## 2015-11-09 NOTE — Progress Notes (Signed)
Pre visit review using our clinic review tool, if applicable. No additional management support is needed unless otherwise documented below in the visit note. 

## 2015-11-09 NOTE — Progress Notes (Signed)
Chief Complaint  Patient presents with  . Establish Care    pt stated just here for routine check-up-do need to check thyroid     Well Male Greg Harrison is here for a complete physical.   His last physical was >1 year(s) ago.  Current diet: in general, a "healthy" diet- could be better Current exercise: basketball and walking Weight trend: stable Does pt snore? Yes Daytime fatigue? Yes Seat belt? Yes.   Concerns: Would like to have thyroid checked; history of elevated thyroid, had an issue in 2013 and was placed on a  Beta blocker, PTU, was taken off and has not had any issues since. Was never told he had any thyroid problems. He has also been having fatigue and is concerned he may have testosterone deficiency. He is not having a significant decrease in sex drive and has no personal or family hx of deficiency.  Past Medical History:  Diagnosis Date  . History of chicken pox     History reviewed. No pertinent surgical history. Medications  Takes no medications routinely.   Allergies No Known Allergies Family History Family History  Problem Relation Age of Onset  . Heart disease Father   . Hypertension Other     Review of Systems: Constitutional:  no unexpected change in weight, no fevers or chills; +intermittent night sweats Eye:  no recent significant change in vision Ear/Nose/Mouth/Throat:  Ears:  no tinnitus or hearing loss Nose/Mouth/Throat:  no complaints of nasal congestion or bleeding, no sore throat and oral sores Cardiovascular:  no chest pain, no palpitations Respiratory:  no cough and no shortness of breath Gastrointestinal:  no abdominal pain, no change in bowel habits, no nausea, vomiting, diarrhea, or constipation and no black or bloody stool GU:  Male: negative for dysuria, frequency, and incontinence and negative for prostate symptoms Musculoskeletal/Extremities:  no pain, redness, or swelling of the joints Integumentary (Skin/Breast):  no abnormal skin  lesions reported Neurologic:  no headaches, no numbness, tingling Endocrine:  weight changes, masses in the neck, heat/cold intolerance, bowel or skin changes, or cardiovascular system symptoms Hematologic/Lymphatic:  no abnormal bleeding, no HIV risk factors, no swollen nodes, no weight loss  Exam BP 132/86 (BP Location: Left Arm, Patient Position: Sitting, Cuff Size: Large)   Pulse 80   Temp 98.1 F (36.7 C) (Oral)   Ht 6\' 6"  (1.981 m)   Wt 274 lb (124.3 kg)   SpO2 98%   BMI 31.66 kg/m  General:  well developed, well nourished, in no apparent distress Skin:  no significant moles, warts, or growths Head:  no masses, lesions, or tenderness Eyes:  pupils equal and round, sclera anicteric without injection Ears:  canals without lesions, TMs shiny without retraction, no obvious effusion, no erythema Nose:  nares patent, septum midline, mucosa normal Throat/Pharynx:  lips and gingiva without lesion; tongue and uvula midline; non-inflamed pharynx; no exudates or postnasal drainage, poor dentition Neck: neck supple without adenopathy, thyromegaly, or masses Lungs:  clear to auscultation, breath sounds equal bilaterally, no respiratory distress Cardio:  regular rate and rhythm without murmurs, heart sounds without clicks or rubs Abdomen:  abdomen soft, nontender; bowel sounds normal; no masses or organomegaly Genital (male): circumcised penis, no lesions or discharge; testes present bilaterally without masses or tenderness Rectal: Deferred Musculoskeletal:  symmetrical muscle groups noted without atrophy or deformity, 5/5 strength throughout Extremities:  no clubbing, cyanosis, or edema, no deformities, no skin discoloration Neuro:  gait normal; deep tendon reflexes normal and symmetric Psych: well  oriented with normal range of affect and appropriate judgment/insight  Assessment and Plan  Well adult  Obesity - Plan: Comprehensive metabolic panel  Chronic fatigue - Plan: Testosterone  Total,Free,Bio, Males, Ambulatory referral to Pulmonology  Snoring - Plan: Ambulatory referral to Pulmonology  History of hyperthyroidism - Plan: TSH, T4, free  Screening cholesterol level - Plan: Lipid panel  Screening for diabetes mellitus - Plan: Hemoglobin A1c  Screening for HIV without presence of risk factors - Plan: HIV antibody   Well [age] y.o. male. Counseled on diet and exercise. He works night shift and just woke up, will obtain testosterone today. Immunizations, labs, and further orders as above. Follow up in 1 year pending the above workup. The patient voiced understanding and agreement to the plan.  Jilda Rocheicholas Paul SalemWendling, DO 11/09/15 3:53 PM

## 2015-11-10 LAB — HEMOGLOBIN A1C
HEMOGLOBIN A1C: 5.5 % (ref <5.7)
MEAN PLASMA GLUCOSE: 111 mg/dL

## 2015-11-10 LAB — HIV ANTIBODY (ROUTINE TESTING W REFLEX): HIV: NONREACTIVE

## 2015-11-12 LAB — TESTOSTERONE TOTAL,FREE,BIO, MALES
Albumin: 4.4 g/dL (ref 3.6–5.1)
SEX HORMONE BINDING: 19 nmol/L (ref 10–50)
Testosterone, Bioavailable: 97.9 ng/dL — ABNORMAL LOW (ref 110.0–575.0)
Testosterone, Free: 48.6 pg/mL (ref 46.0–224.0)
Testosterone: 256 ng/dL (ref 250–827)

## 2015-11-16 ENCOUNTER — Ambulatory Visit (INDEPENDENT_AMBULATORY_CARE_PROVIDER_SITE_OTHER): Payer: BLUE CROSS/BLUE SHIELD | Admitting: Family Medicine

## 2015-11-16 ENCOUNTER — Encounter: Payer: Self-pay | Admitting: Family Medicine

## 2015-11-16 VITALS — BP 144/102 | HR 87 | Temp 97.3°F | Ht 78.0 in | Wt 278.2 lb

## 2015-11-16 DIAGNOSIS — Z23 Encounter for immunization: Secondary | ICD-10-CM | POA: Diagnosis not present

## 2015-11-16 DIAGNOSIS — E039 Hypothyroidism, unspecified: Secondary | ICD-10-CM

## 2015-11-16 DIAGNOSIS — R030 Elevated blood-pressure reading, without diagnosis of hypertension: Secondary | ICD-10-CM | POA: Diagnosis not present

## 2015-11-16 HISTORY — DX: Hypothyroidism, unspecified: E03.9

## 2015-11-16 MED ORDER — LEVOTHYROXINE SODIUM 100 MCG PO TABS: 100.0000 ug | ORAL_TABLET | Freq: Every day | ORAL | 1 refills | Status: DC

## 2015-11-16 NOTE — Patient Instructions (Signed)
Take this medication on an empty stomach 30 minutes prior to eating.

## 2015-11-16 NOTE — Progress Notes (Signed)
Chief Complaint  Patient presents with  . Results    review thyroid results    Subjective: Patient is a 35 y.o. male here for lab review.  Low thyroid levels. His symptoms include weight gain despite dieting and exercise in addition to fatigue. He had elevated thyroid levels in the past and was placed on medications. No hx of thyroid removal or radiation. No fam hx of thyroid issues.   ROS: Heart: Denies chest pain or palpitations Endo: As noted in HPI  Family History  Problem Relation Age of Onset  . Heart disease Father   . Hypertension Other    Past Medical History:  Diagnosis Date  . History of chicken pox   . Hypothyroidism 11/16/2015   No Known Allergies  Current Outpatient Prescriptions:  .  levothyroxine (SYNTHROID, LEVOTHROID) 100 MCG tablet, Take 1 tablet (100 mcg total) by mouth daily., Disp: 30 tablet, Rfl: 1  Objective: BP (!) 144/102 (BP Location: Left Arm, Patient Position: Sitting, Cuff Size: Large)   Pulse 87   Temp 97.3 F (36.3 C) (Oral)   Ht 6\' 6"  (1.981 m)   Wt 278 lb 3.2 oz (126.2 kg)   SpO2 97%   BMI 32.15 kg/m  General: Awake, appears stated age HEENT: MMM, EOMi Neck: No masses or asymmetry, no thyromegaly Heart: RRR, no murmurs, no LE edema Lungs: CTAB, no rales, wheezes or rhonchi. Normal effort Psych: Age appropriate judgment and insight, normal affect and mood  Assessment and Plan: Hypothyroidism, unspecified type - Plan: levothyroxine (SYNTHROID, LEVOTHROID) 100 MCG tablet  Elevated BP without diagnosis of hypertension  Encounter for immunization - Plan: Flu Vaccine QUAD 36+ mos IM  Orders as above. Will start around half his weight based dose. Instructed to take it 30 min before eating on an empty stomach.  F/u for BP check at his next visit. The patient voiced understanding and agreement to the plan.  Jilda Rocheicholas Paul [place] RanchW[place], DO 11/16/15  4:50 PM

## 2015-12-26 ENCOUNTER — Encounter: Payer: Self-pay | Admitting: Pulmonary Disease

## 2015-12-28 ENCOUNTER — Encounter: Payer: Self-pay | Admitting: Family Medicine

## 2015-12-28 ENCOUNTER — Ambulatory Visit (INDEPENDENT_AMBULATORY_CARE_PROVIDER_SITE_OTHER): Payer: BLUE CROSS/BLUE SHIELD | Admitting: Family Medicine

## 2015-12-28 VITALS — BP 149/99 | HR 76 | Temp 98.5°F | Ht 78.0 in | Wt 273.0 lb

## 2015-12-28 DIAGNOSIS — E039 Hypothyroidism, unspecified: Secondary | ICD-10-CM

## 2015-12-28 DIAGNOSIS — R030 Elevated blood-pressure reading, without diagnosis of hypertension: Secondary | ICD-10-CM | POA: Diagnosis not present

## 2015-12-28 LAB — T4, FREE: FREE T4: 1.1 ng/dL (ref 0.8–1.8)

## 2015-12-28 LAB — TSH: TSH: 13.27 mIU/L — ABNORMAL HIGH (ref 0.40–4.50)

## 2015-12-28 NOTE — Progress Notes (Signed)
Chief Complaint  Patient presents with  . Follow-up    6 weeks on thyroid    Subjective: Patient is a 35 y.o. male here for thyroid f/u.  Patient presents for follow-up of hypothyroidism.  Reports compliance with medication 100 mcg daily. Current symptoms include: denies fatigue, weight changes, heat/cold intolerance, bowel/skin changes or CVS symptoms; his fatigue has resolved. He believes his dose should be unchanged  ROS: Endo: No masses in neck or heat/cold intolerance Lungs: Denies SOB or cough  Family History  Problem Relation Age of Onset  . Heart disease Father   . Hypertension Other    Past Medical History:  Diagnosis Date  . History of chicken pox   . Hypothyroidism 11/16/2015   No Known Allergies  Current Outpatient Prescriptions:  .  levothyroxine (SYNTHROID, LEVOTHROID) 100 MCG tablet, Take 1 tablet (100 mcg total) by mouth daily., Disp: 30 tablet, Rfl: 1  Objective: BP (!) 149/99 (BP Location: Left Arm, Patient Position: Sitting, Cuff Size: Large)   Pulse 76   Temp 98.5 F (36.9 C) (Oral)   Ht 6\' 6"  (1.981 m)   Wt 273 lb (123.8 kg)   BMI 31.55 kg/m  General: Awake, appears stated age HEENT: MMM, EOMi Neck: No masses, asymmetry, or thyromegaly/thyroid nodules noted Heart: RRR, no murmurs Lungs: CTAB, no rales, wheezes or rhonchi. No accessory muscle use Abd: BS+, soft, NT, ND, no masses or organomegaly Psych: Age appropriate judgment and insight, normal affect and mood  Assessment and Plan: Hypothyroidism, unspecified type - Plan: T4, free, TSH, CANCELED: TSH, CANCELED: T4, free  Elevated blood pressure reading - Plan: TSH  Orders as above. It is a medication for now. Will recheck thyroid and adjust accordingly. Elevated blood pressure reading today. I would like him to get a home wrist cuff and monitor his blood pressure is 3-4 times weekly. I will see him again for this in 4 weeks. If his thyroid is well controlled I will see him again for this  in 3 months. The patient voiced understanding and agreement to the plan.  Jilda Rocheicholas Paul Euh[place]leeWendling, DO 12/28/15  4:46 PM

## 2015-12-28 NOTE — Progress Notes (Signed)
Pre visit review using our clinic review tool, if applicable. No additional management support is needed unless otherwise documented below in the visit note. 

## 2015-12-28 NOTE — Patient Instructions (Addendum)
Get a home blood pressure cuff (wrist cuff may be easiest to use). Check your BP 3-4 times weekly over the next 4 weeks. Write down your readings and bring them to your next appointment.   Stop smoking.

## 2016-01-13 ENCOUNTER — Other Ambulatory Visit: Payer: Self-pay | Admitting: Family Medicine

## 2016-01-13 DIAGNOSIS — E039 Hypothyroidism, unspecified: Secondary | ICD-10-CM

## 2016-01-14 ENCOUNTER — Institutional Professional Consult (permissible substitution): Payer: BLUE CROSS/BLUE SHIELD | Admitting: Pulmonary Disease

## 2016-01-14 NOTE — Telephone Encounter (Signed)
Pt was last seen 12/28/15 and was told to FU 01/25/16. I have refilled Rx for #30 tablets and 0 refills and instructed pharmacy to alarm patient to schedule appointment. Also LVMOM for pt to return call into the office. TL/CMA

## 2016-02-18 ENCOUNTER — Other Ambulatory Visit: Payer: Self-pay | Admitting: Family Medicine

## 2016-02-18 DIAGNOSIS — E039 Hypothyroidism, unspecified: Secondary | ICD-10-CM

## 2016-04-10 ENCOUNTER — Other Ambulatory Visit: Payer: Self-pay | Admitting: Family Medicine

## 2016-04-10 DIAGNOSIS — E039 Hypothyroidism, unspecified: Secondary | ICD-10-CM

## 2016-04-14 NOTE — Telephone Encounter (Signed)
°  [id number] [place]- [place], [place] - [address] [phone] (Phone) [phone] (Fax)   Pharmacy requesting a refill,  levothyroxine (SYNTHROID, LEVOTHROID) 100 MCG tablet

## 2016-04-15 NOTE — Telephone Encounter (Signed)
Refill done.  

## 2016-05-13 ENCOUNTER — Other Ambulatory Visit: Payer: Self-pay | Admitting: Family Medicine

## 2016-05-13 DIAGNOSIS — E039 Hypothyroidism, unspecified: Secondary | ICD-10-CM

## 2016-05-22 ENCOUNTER — Other Ambulatory Visit: Payer: Self-pay | Admitting: *Deleted

## 2016-05-22 DIAGNOSIS — E039 Hypothyroidism, unspecified: Secondary | ICD-10-CM

## 2016-05-22 MED ORDER — LEVOTHYROXINE SODIUM 100 MCG PO TABS: ORAL_TABLET | ORAL | 0 refills | Status: DC

## 2016-05-22 NOTE — Telephone Encounter (Signed)
Rx sent to the pharmacy by e-script.  However only given #15.  The patient needs further evaluation and/or laboratory testing before further refills are given.  Ask patient to make an appointment for this.//AB/CMA

## 2016-07-11 ENCOUNTER — Other Ambulatory Visit: Payer: Self-pay | Admitting: Family Medicine

## 2016-07-11 DIAGNOSIS — E039 Hypothyroidism, unspecified: Secondary | ICD-10-CM

## 2016-07-11 NOTE — Telephone Encounter (Signed)
Rx approved and sent to the pharmacy by e-script. However only given #15.  The patient needs further evaluation and/or laboratory testing before further refills are given.  Ask patient to make an appointment for this.//AB/CMA

## 2016-08-03 ENCOUNTER — Other Ambulatory Visit: Payer: Self-pay | Admitting: Family Medicine

## 2016-08-03 DIAGNOSIS — E039 Hypothyroidism, unspecified: Secondary | ICD-10-CM

## 2016-08-04 NOTE — Telephone Encounter (Signed)
Rx denied.  Pt needs to schedule an appointment.//AB/CMA

## 2016-08-07 ENCOUNTER — Encounter: Payer: BLUE CROSS/BLUE SHIELD | Admitting: Family Medicine

## 2016-08-08 ENCOUNTER — Ambulatory Visit: Payer: BLUE CROSS/BLUE SHIELD | Admitting: Family Medicine

## 2016-08-14 ENCOUNTER — Ambulatory Visit (INDEPENDENT_AMBULATORY_CARE_PROVIDER_SITE_OTHER): Payer: BLUE CROSS/BLUE SHIELD | Admitting: Family Medicine

## 2016-08-14 ENCOUNTER — Encounter: Payer: Self-pay | Admitting: Family Medicine

## 2016-08-14 VITALS — BP 112/80 | HR 85 | Temp 98.4°F | Ht 78.0 in | Wt 266.5 lb

## 2016-08-14 DIAGNOSIS — E039 Hypothyroidism, unspecified: Secondary | ICD-10-CM

## 2016-08-14 DIAGNOSIS — Z23 Encounter for immunization: Secondary | ICD-10-CM | POA: Diagnosis not present

## 2016-08-14 DIAGNOSIS — R0683 Snoring: Secondary | ICD-10-CM

## 2016-08-14 DIAGNOSIS — Z0000 Encounter for general adult medical examination without abnormal findings: Secondary | ICD-10-CM | POA: Diagnosis not present

## 2016-08-14 DIAGNOSIS — Z0001 Encounter for general adult medical examination with abnormal findings: Secondary | ICD-10-CM | POA: Diagnosis not present

## 2016-08-14 MED ORDER — LEVOTHYROXINE SODIUM 100 MCG PO TABS: ORAL_TABLET | ORAL | 5 refills | Status: DC

## 2016-08-14 NOTE — Patient Instructions (Signed)
Give us 2-3 business days to get the results of your labs back.   If you do not hear anything about your referral in the next 1-2 weeks, call our office and ask for an update.  Let us know if you need anything. 

## 2016-08-14 NOTE — Progress Notes (Signed)
Chief Complaint  Patient presents with  . Annual Exam    Well Male Greg Harrison is here for a complete physical.   His last physical was >1 year ago.  Current diet: in general, a "healthy" diet   Current exercise: playing basketball 5-6 days weekly, push ups Weight trend: losing steadily Does pt snore? Yes- was referred for OSA eval and no-showed. Interested in going back Daytime fatigue? Yes Seat belt? Yes.    Health maintenance Tetanus- No HIV- Yes  Past Medical History:  Diagnosis Date  . History of chicken pox   . Hypothyroidism 11/16/2015    No past surgical history on file. Medications  Current Outpatient Prescriptions on File Prior to Visit  Medication Sig Dispense Refill  . levothyroxine (SYNTHROID, LEVOTHROID) 100 MCG tablet TAKE 1 TABLET BY MOUTH DAILY *NEED APPOINTMENT FOR ANY FURTHER REFILLS* 15 tablet 0   Allergies No Known Allergies Family History Family History  Problem Relation Age of Onset  . Heart disease Father   . Hypertension Other     Review of Systems: Constitutional:  no unexpected change in weight, no fevers or chills Eye:  no recent significant change in vision Ear/Nose/Mouth/Throat:  Ears:  no tinnitus or hearing loss Nose/Mouth/Throat:  no complaints of nasal congestion or bleeding, no sore throat and oral sores Cardiovascular:  no chest pain, no palpitations Respiratory:  no cough and no shortness of breath Gastrointestinal:  no abdominal pain, no change in bowel habits, no nausea, vomiting, diarrhea, or constipation and no black or bloody stool GU:  Male: negative for dysuria, frequency, and incontinence and negative for prostate symptoms Musculoskeletal/Extremities:  no pain, redness, or swelling of the joints Integumentary (Skin/Breast):  no abnormal skin lesions reported Neurologic:  no headaches, no numbness, tingling Endocrine: No weight changes, masses in the neck, heat/cold intolerance, bowel or skin changes, or cardiovascular  system symptoms Hematologic/Lymphatic:  no abnormal bleeding, no HIV risk factors, no night sweats, no swollen nodes, no weight loss  Exam BP 112/80 (BP Location: Left Arm, Patient Position: Sitting, Cuff Size: Large)   Pulse 85   Temp 98.4 F (36.9 C) (Oral)   Ht 6\' 6"  (1.981 m)   Wt 266 lb 8 oz (120.9 kg)   SpO2 95%   BMI 30.80 kg/m  General:  well developed, well nourished, in no apparent distress Skin:  no significant moles, warts, or growths Head:  no masses, lesions, or tenderness Eyes:  pupils equal and round, sclera anicteric without injection Ears:  canals without lesions, TMs shiny without retraction, no obvious effusion, no erythema Nose:  nares patent, septum midline, mucosa normal Throat/Pharynx:  lips and gingiva without lesion; tongue and uvula midline; non-inflamed pharynx; no exudates or postnasal drainage Neck: neck supple without adenopathy, thyromegaly, or masses Lungs:  clear to auscultation, breath sounds equal bilaterally, no respiratory distress Cardio:  regular rate and rhythm without murmurs, heart sounds without clicks or rubs Abdomen:  abdomen soft, nontender; bowel sounds normal; no masses or organomegaly Genital (male): circumcised penis, no lesions or discharge; testes present bilaterally without masses or tenderness Rectal: Deferred Musculoskeletal:  symmetrical muscle groups noted without atrophy or deformity Extremities:  no clubbing, cyanosis, or edema, no deformities, no skin discoloration Neuro:  gait normal; deep tendon reflexes normal and symmetric Psych: well oriented with normal range of affect and appropriate judgment/insight  Assessment and Plan  Well adult exam - Plan: CBC, Comprehensive metabolic panel, Lipid panel  Hypothyroidism, unspecified type - Plan: levothyroxine (SYNTHROID, LEVOTHROID) 100 MCG  tablet, TSH, T4, free  Snoring - Plan: Ambulatory referral to Pulmonology   Well 36 y.o. male. Counseled on diet and exercise. Doing  well. Refer back to sleep. Reorder thyroid medication, will check today. May need to recheck again in 6 weeks as he hasn't been on it consistently. Other orders as above. Follow up in 6 mo pending the above workup. The patient voiced understanding and agreement to the plan.  Jilda Roche [place], DO 08/14/16 3:10 PM

## 2016-08-14 NOTE — Progress Notes (Signed)
Pre visit review using our clinic review tool, if applicable. No additional management support is needed unless otherwise documented below in the visit note. 

## 2016-08-14 NOTE — Addendum Note (Signed)
Addended by: Scharlene GlossEWING, ROBIN B on: 08/14/2016 03:30 PM   Modules accepted: Orders

## 2016-08-15 ENCOUNTER — Telehealth: Payer: Self-pay | Admitting: Family Medicine

## 2016-08-15 LAB — COMPREHENSIVE METABOLIC PANEL
ALT: 15 U/L (ref 0–53)
AST: 30 U/L (ref 0–37)
Albumin: 4.2 g/dL (ref 3.5–5.2)
Alkaline Phosphatase: 53 U/L (ref 39–117)
BUN: 18 mg/dL (ref 6–23)
CALCIUM: 9.4 mg/dL (ref 8.4–10.5)
CHLORIDE: 106 meq/L (ref 96–112)
CO2: 26 meq/L (ref 19–32)
Creatinine, Ser: 1.41 mg/dL (ref 0.40–1.50)
GFR: 72.18 mL/min (ref >60.00)
GLUCOSE: 95 mg/dL (ref 70–99)
POTASSIUM: 3.7 meq/L (ref 3.5–5.1)
Sodium: 138 mEq/L (ref 135–145)
Total Bilirubin: 0.5 mg/dL (ref 0.2–1.2)
Total Protein: 6.9 g/dL (ref 6.0–8.3)

## 2016-08-15 LAB — CBC
HCT: 45.9 % (ref 39.0–52.0)
HEMOGLOBIN: 15.7 g/dL (ref 13.0–17.0)
MCHC: 34.2 g/dL (ref 30.0–36.0)
MCV: 86.9 fl (ref 78.0–100.0)
Platelets: 228 10*3/uL (ref 150.0–400.0)
RBC: 5.28 Mil/uL (ref 4.22–5.81)
RDW: 14.9 % (ref 11.5–15.5)
WBC: 5.6 10*3/uL (ref 4.0–10.5)

## 2016-08-15 LAB — LIPID PANEL
Cholesterol: 193 mg/dL (ref 0–200)
HDL: 68.1 mg/dL (ref >39.00)
LDL CALC: 93 mg/dL (ref 0–99)
NONHDL: 124.9
TRIGLYCERIDES: 161 mg/dL — AB (ref 0.0–149.0)
Total CHOL/HDL Ratio: 3
VLDL: 32.2 mg/dL (ref 0.0–40.0)

## 2016-08-15 LAB — T4, FREE: Free T4: 0.7 ng/dL (ref 0.60–1.60)

## 2016-08-15 LAB — TSH: TSH: 15.93 u[IU]/mL — ABNORMAL HIGH (ref 0.35–4.50)

## 2016-08-15 NOTE — Telephone Encounter (Signed)
Relation to ON:GEXBpt:self Call back number:[phone]  Reason for call:  Patient dropped off physical form today, last seen 08/14/16 for he's annual and forgot to bring form with in, patient states form is due Wednesday 08/20/16, please advise.

## 2016-08-18 ENCOUNTER — Telehealth: Payer: Self-pay | Admitting: Family Medicine

## 2016-08-18 NOTE — Telephone Encounter (Signed)
Form completed and handed to AB for address stamp and copy to be made.

## 2016-08-18 NOTE — Telephone Encounter (Signed)
Where was this paperwork placed, I do not have it in the morning paperwork. Thanks/SLS 07/09

## 2016-08-18 NOTE — Telephone Encounter (Signed)
Returning call regarding lab results 

## 2016-08-18 NOTE — Telephone Encounter (Signed)
Called and spoke with the pt and informed him that the form is completed and ready,and I will place it up front for him to pick up.  Pt verbalized understanding and stated that he will come by today and pick up the form.  Copy made to be scanned in the chart.//AB/CMA

## 2016-08-18 NOTE — Telephone Encounter (Signed)
Form was handed to Dr. Carmelia RollerWendling directly.

## 2016-09-09 ENCOUNTER — Telehealth: Payer: Self-pay | Admitting: Family Medicine

## 2016-09-09 NOTE — Telephone Encounter (Signed)
Called pt 2x and LVM to verify address since we got a returned mail lab result.

## 2017-01-22 ENCOUNTER — Other Ambulatory Visit: Payer: Self-pay | Admitting: Family Medicine

## 2017-01-22 DIAGNOSIS — E039 Hypothyroidism, unspecified: Secondary | ICD-10-CM

## 2017-01-22 MED ORDER — LEVOTHYROXINE SODIUM 100 MCG PO TABS: ORAL_TABLET | ORAL | 0 refills | Status: DC

## 2017-06-11 ENCOUNTER — Other Ambulatory Visit: Payer: Self-pay | Admitting: Family Medicine

## 2017-06-11 DIAGNOSIS — E039 Hypothyroidism, unspecified: Secondary | ICD-10-CM

## 2017-07-14 ENCOUNTER — Other Ambulatory Visit: Payer: Self-pay | Admitting: Family Medicine

## 2017-07-14 DIAGNOSIS — E039 Hypothyroidism, unspecified: Secondary | ICD-10-CM

## 2017-08-17 ENCOUNTER — Encounter: Payer: BLUE CROSS/BLUE SHIELD | Admitting: Family Medicine

## 2017-08-28 ENCOUNTER — Encounter: Payer: Self-pay | Admitting: Family Medicine

## 2017-09-02 ENCOUNTER — Encounter: Payer: Self-pay | Admitting: Family Medicine

## 2017-09-02 ENCOUNTER — Ambulatory Visit (INDEPENDENT_AMBULATORY_CARE_PROVIDER_SITE_OTHER): Payer: BLUE CROSS/BLUE SHIELD | Admitting: Family Medicine

## 2017-09-02 VITALS — BP 122/80 | HR 78 | Temp 98.5°F | Ht 78.0 in | Wt 286.0 lb

## 2017-09-02 DIAGNOSIS — E039 Hypothyroidism, unspecified: Secondary | ICD-10-CM | POA: Diagnosis not present

## 2017-09-02 DIAGNOSIS — R0683 Snoring: Secondary | ICD-10-CM

## 2017-09-02 DIAGNOSIS — Z0000 Encounter for general adult medical examination without abnormal findings: Secondary | ICD-10-CM | POA: Diagnosis not present

## 2017-09-02 NOTE — Progress Notes (Signed)
Chief Complaint  Patient presents with  . Annual Exam    Well Male Greg Harrison is here for a complete physical.   His last physical was >1 year ago.  Current diet: in general, a "healthy" diet.   Current exercise: playing basketball, running, some lifting Weight trend: stable  Does pt snore? Yes. Awake time fatigue? Yes. Had appt with pulm for OSA eval but missed it. Seat belt? Yes.    Health maintenance Tetanus- Yes HIV- Yes  PCV23- 16 yrs ago  Past Medical History:  Diagnosis Date  . History of chicken pox   . Hypothyroidism 11/16/2015     Past Surgical History:  Procedure Laterality Date  . NO PAST SURGERIES      Medications  Current Outpatient Medications on File Prior to Visit  Medication Sig Dispense Refill  . levothyroxine (SYNTHROID, LEVOTHROID) 100 MCG tablet TAKE 1 TABLET BY MOUTH EVERY DAY 30 tablet 1    Allergies No Known Allergies  Family History Family History  Problem Relation Age of Onset  . Heart disease Father   . Hypertension Other     Review of Systems: Constitutional: no fevers or chills Eye:  no recent significant change in vision Ear/Nose/Mouth/Throat:  Ears:  no tinnitus or hearing loss Nose/Mouth/Throat:  no complaints of nasal congestion, no sore throat Cardiovascular:  no chest pain, no palpitations Respiratory:  no cough and no shortness of breath Gastrointestinal:  no abdominal pain, no change in bowel habits GU:  Male: negative for dysuria, frequency, and incontinence and negative for prostate symptoms Musculoskeletal/Extremities:  no pain, redness, or swelling of the joints Integumentary (Skin/Breast):  no abnormal skin lesions reported Neurologic:  no headaches, no numbness, tingling Endocrine: +wt gain Hematologic/Lymphatic:  no night sweats  Exam BP 122/80 (BP Location: Left Arm, Patient Position: Sitting, Cuff Size: Normal)   Pulse 78   Temp 98.5 F (36.9 C) (Oral)   Ht 6\' 6"  (1.981 m)   Wt 286 lb (129.7 kg)    SpO2 96%   BMI 33.05 kg/m  General:  well developed, well nourished, in no apparent distress Skin:  no significant moles, warts, or growths Head:  no masses, lesions, or tenderness Eyes:  pupils equal and round, sclera anicteric without injection Ears:  canals without lesions, TMs shiny without retraction, no obvious effusion, no erythema Nose:  nares patent, septum midline, mucosa normal Throat/Pharynx:  lips and gingiva without lesion; tongue and uvula midline; non-inflamed pharynx; no exudates or postnasal drainage Neck: neck supple without adenopathy, thyromegaly, or masses Lungs:  clear to auscultation, breath sounds equal bilaterally, no respiratory distress Cardio:  regular rate and rhythm, no bruits, no LE edema Abdomen:  abdomen soft, nontender; bowel sounds normal; no masses or organomegaly Genital (male): circumcised penis, no lesions or discharge; testes present bilaterally without masses or tenderness Rectal: Deferred Musculoskeletal:  symmetrical muscle groups noted without atrophy or deformity Extremities:  no clubbing, cyanosis, or edema, no deformities, no skin discoloration Neuro:  gait normal; deep tendon reflexes normal and symmetric Psych: well oriented with normal range of affect and appropriate judgment/insight  Assessment and Plan  Well adult exam - Plan: Comprehensive metabolic panel, Lipid panel  Snoring - Plan: Ambulatory referral to Pulmonology  Hypothyroidism, unspecified type - Plan: TSH, T4, free   Well 37 y.o. male. Counseled on diet and exercise. Self testicular checks.  Refer back to pulm for OSA eval. Ck thyroid, will probably change to brand Synthroid as he has been having issues. Other orders as  above. Follow up in 6-7 weeks to recheck thyroid. The patient voiced understanding and agreement to the plan.  Jilda Roche [place], DO 09/02/17 2:58 PM

## 2017-09-02 NOTE — Progress Notes (Signed)
Pre visit review using our clinic review tool, if applicable. No additional management support is needed unless otherwise documented below in the visit note. 

## 2017-09-02 NOTE — Patient Instructions (Addendum)
Do monthly self testicular checks in the shower. You are feeling for lumps/bumps that don't belong. If you feel anything like this, let me know!  1-2 days to get the results of your labs back. We will try brand name Synthroid for your thyroid.  If you do not hear anything about your referral in the next 1-2 weeks, call our office and ask for an update.  Let us know if you need anything.

## 2017-09-03 ENCOUNTER — Other Ambulatory Visit: Payer: Self-pay | Admitting: Family Medicine

## 2017-09-03 LAB — COMPREHENSIVE METABOLIC PANEL
ALT: 15 U/L (ref 0–53)
AST: 22 U/L (ref 0–37)
Albumin: 4.3 g/dL (ref 3.5–5.2)
Alkaline Phosphatase: 56 U/L (ref 39–117)
BUN: 16 mg/dL (ref 6–23)
CALCIUM: 9.8 mg/dL (ref 8.4–10.5)
CHLORIDE: 103 meq/L (ref 96–112)
CO2: 26 meq/L (ref 19–32)
Creatinine, Ser: 1.35 mg/dL (ref 0.40–1.50)
GFR: 75.48 mL/min (ref >60.00)
Glucose, Bld: 95 mg/dL (ref 70–99)
POTASSIUM: 4.1 meq/L (ref 3.5–5.1)
Sodium: 138 mEq/L (ref 135–145)
Total Bilirubin: 0.4 mg/dL (ref 0.2–1.2)
Total Protein: 6.9 g/dL (ref 6.0–8.3)

## 2017-09-03 LAB — LIPID PANEL
CHOLESTEROL: 198 mg/dL (ref 0–200)
HDL: 71.9 mg/dL (ref >39.00)
LDL Cholesterol: 95 mg/dL (ref 0–99)
NonHDL: 125.7
Total CHOL/HDL Ratio: 3
Triglycerides: 153 mg/dL — ABNORMAL HIGH (ref 0.0–149.0)
VLDL: 30.6 mg/dL (ref 0.0–40.0)

## 2017-09-03 LAB — T4, FREE: Free T4: 0.63 ng/dL (ref 0.60–1.60)

## 2017-09-03 LAB — TSH: TSH: 17.89 u[IU]/mL — ABNORMAL HIGH (ref 0.35–4.50)

## 2017-09-03 MED ORDER — LEVOTHYROXINE SODIUM 112 MCG PO TABS: 112.0000 ug | ORAL_TABLET | Freq: Every day | ORAL | 3 refills | Status: DC

## 2017-09-07 ENCOUNTER — Telehealth: Payer: Self-pay | Admitting: Family Medicine

## 2017-09-07 NOTE — Telephone Encounter (Signed)
Charted in result notes. 

## 2017-09-07 NOTE — Telephone Encounter (Signed)
Returning call Copied from CRM [id number]. Topic: Quick Communication - Lab Results >> Sep 07, 2017  8:37 AM [place], Greg Harrison, CMA wrote: Called patient to inform them of 09/07/2017 lab results. When patient returns call, triage nurse may disclose results.

## 2017-09-07 NOTE — Telephone Encounter (Signed)
Pt Is returning call for results

## 2017-09-07 NOTE — Telephone Encounter (Signed)
See result notes. 

## 2017-11-09 ENCOUNTER — Ambulatory Visit: Payer: BLUE CROSS/BLUE SHIELD | Admitting: Family Medicine

## 2017-11-09 DIAGNOSIS — Z0289 Encounter for other administrative examinations: Secondary | ICD-10-CM

## 2017-11-16 ENCOUNTER — Ambulatory Visit: Payer: BLUE CROSS/BLUE SHIELD | Admitting: Family Medicine

## 2017-11-16 ENCOUNTER — Encounter: Payer: Self-pay | Admitting: Family Medicine

## 2017-11-16 ENCOUNTER — Other Ambulatory Visit (HOSPITAL_COMMUNITY)
Admission: RE | Admit: 2017-11-16 | Discharge: 2017-11-16 | Disposition: A | Discharge status: Home / Self Care | Payer: BLUE CROSS/BLUE SHIELD | Source: Ambulatory Visit | Attending: Family Medicine | Admitting: Family Medicine

## 2017-11-16 VITALS — BP 120/82 | HR 83 | Temp 98.7°F | Ht 78.0 in | Wt 286.4 lb

## 2017-11-16 DIAGNOSIS — E669 Obesity, unspecified: Secondary | ICD-10-CM | POA: Diagnosis not present

## 2017-11-16 DIAGNOSIS — Z114 Encounter for screening for human immunodeficiency virus [HIV]: Secondary | ICD-10-CM

## 2017-11-16 DIAGNOSIS — Z23 Encounter for immunization: Secondary | ICD-10-CM

## 2017-11-16 DIAGNOSIS — E039 Hypothyroidism, unspecified: Secondary | ICD-10-CM | POA: Diagnosis not present

## 2017-11-16 DIAGNOSIS — Z113 Encounter for screening for infections with a predominantly sexual mode of transmission: Secondary | ICD-10-CM

## 2017-11-16 NOTE — Progress Notes (Signed)
Pre visit review using our clinic review tool, if applicable. No additional management support is needed unless otherwise documented below in the visit note. 

## 2017-11-16 NOTE — Patient Instructions (Signed)
I will refill your medicine once we get the results back.   If you do not hear anything about your referral in the next 1-2 weeks, call our office and ask for an update.  Let us know if you need anything.

## 2017-11-16 NOTE — Progress Notes (Signed)
Chief Complaint  Patient presents with  . Follow-up    thyroid    Subjective: Patient is a 36 y.o. male here for thyroid f/u.  Had synthroid increased after T4 being at lower limits of normal. Still having fatigue and inability to lose weight but feels better overall. Does not wish to change his medication.   Hx of obesity. Diet is healthy, pt will exercise routinely. Has difficulty losing weight.  Wishes to be screened for STI's.    ROS: Heart: Denies chest pain  Lungs: Denies SOB   Past Medical History:  Diagnosis Date  . History of chicken pox   . Hypothyroidism 11/16/2015    Objective: BP 120/82 (BP Location: Left Arm, Patient Position: Sitting, Cuff Size: Large)   Pulse 83   Temp 98.7 F (37.1 C) (Oral)   Ht 6\' 6"  (1.981 m)   Wt 286 lb 6 oz (129.9 kg)   SpO2 95%   BMI 33.09 kg/m  General: Awake, appears stated age HEENT: MMM, EOMi Neck: Supple, symmetric, no nodules noted Heart: RRR, no LE edema Lungs: CTAB, no rales, wheezes or rhonchi. No accessory muscle use Psych: Age appropriate judgment and insight, normal affect and mood  Assessment and Plan: Hypothyroidism, unspecified type - Plan: TSH, T4, free  Need for influenza vaccination - Plan: Flu Vaccine QUAD 6+ mos PF IM (Fluarix Quad PF)  Obesity (BMI 30-39.9) - Plan: Amb Ref to [facility]  Routine screening for STI (sexually transmitted infection) - Plan: Urine cytology ancillary only  Screening for HIV (human immunodeficiency virus) - Plan: HIV Antibody (routine testing w rflx)  Orders as above. Cont current dose for now. Counseled on diet and exercise. Refer to [facility]. Ck STI panel and HIV per pt request. F/u in 6 mo or prn. The patient voiced understanding and agreement to the plan.  Jilda Roche [place], DO 11/16/17  3:31 PM

## 2017-11-17 LAB — T4, FREE: Free T4: 0.86 ng/dL (ref 0.60–1.60)

## 2017-11-17 LAB — TSH: TSH: 7.65 u[IU]/mL — ABNORMAL HIGH (ref 0.35–4.50)

## 2017-11-17 LAB — HIV ANTIBODY (ROUTINE TESTING W REFLEX): HIV 1&2 Ab, 4th Generation: NONREACTIVE

## 2017-11-18 ENCOUNTER — Other Ambulatory Visit: Payer: Self-pay | Admitting: Family Medicine

## 2017-11-18 LAB — URINE CYTOLOGY ANCILLARY ONLY
Chlamydia: NEGATIVE
NEISSERIA GONORRHEA: NEGATIVE
TRICH (WINDOWPATH): POSITIVE — AB

## 2017-11-18 MED ORDER — METRONIDAZOLE 500 MG PO TABS: 500.0000 mg | ORAL_TABLET | Freq: Two times a day (BID) | ORAL | 0 refills | Status: DC

## 2017-11-18 NOTE — Progress Notes (Signed)
Flagyl sent in for trich.

## 2017-11-23 ENCOUNTER — Telehealth: Payer: Self-pay | Admitting: Family Medicine

## 2017-11-23 NOTE — Telephone Encounter (Signed)
Copied from CRM [id number]. Topic: Quick Communication - See Telephone Encounter >> Nov 23, 2017 10:18 AM Jens Som A wrote: CRM for notification. See Telephone encounter for: 11/23/17.  Patient is requesting a nurse to call him back regarding his 11/16/17 labs  Patient call back number [phone]

## 2017-11-23 NOTE — Telephone Encounter (Signed)
Called left message to call back 

## 2017-11-23 NOTE — Telephone Encounter (Signed)
Patient returned call and did inform him of results//sent through my chart. He did verbalized understanding//agreed to all.

## 2017-12-04 ENCOUNTER — Telehealth: Payer: Self-pay

## 2017-12-04 NOTE — Telephone Encounter (Signed)
Pt. Phoned [facility] to review labwork results, stating he did not receive all of the results. Author reviewed lab results from 10/7 and pt. Confirmed that he finished his course of flagyl as prescribed for trichomonas. Routed to Dr. Carmelia Roller to advise on need for follow-up per pt. request.

## 2018-01-26 ENCOUNTER — Other Ambulatory Visit: Payer: Self-pay | Admitting: Family Medicine

## 2018-03-09 ENCOUNTER — Encounter (INDEPENDENT_AMBULATORY_CARE_PROVIDER_SITE_OTHER): Payer: Self-pay

## 2018-03-23 ENCOUNTER — Ambulatory Visit (INDEPENDENT_AMBULATORY_CARE_PROVIDER_SITE_OTHER): Payer: Self-pay | Admitting: Bariatrics

## 2018-04-06 ENCOUNTER — Ambulatory Visit (INDEPENDENT_AMBULATORY_CARE_PROVIDER_SITE_OTHER): Payer: Self-pay | Admitting: Bariatrics

## 2018-05-19 ENCOUNTER — Ambulatory Visit: Payer: BLUE CROSS/BLUE SHIELD | Admitting: Family Medicine

## 2018-06-07 ENCOUNTER — Other Ambulatory Visit: Payer: Self-pay | Admitting: Family Medicine

## 2018-06-13 ENCOUNTER — Other Ambulatory Visit: Payer: Self-pay | Admitting: Family Medicine

## 2018-06-14 ENCOUNTER — Telehealth: Payer: Self-pay

## 2018-06-14 NOTE — Telephone Encounter (Signed)
Called patient to schedule appointment with Dr. Carmelia Roller for medication refills. Left message to return call.

## 2018-06-14 NOTE — Telephone Encounter (Signed)
Copied from CRM [id number]. Topic: Appointment Scheduling - Scheduling Inquiry for Clinic >> Jun 14, 2018 10:53 AM Lorrine Kin, NT wrote: Reason for CRM: Patient calling and is needing to schedule a medication refill appointment. Attempted office x2, no answer. Please advise.  CB#: [phone]

## 2018-06-15 ENCOUNTER — Ambulatory Visit (INDEPENDENT_AMBULATORY_CARE_PROVIDER_SITE_OTHER): Payer: Self-pay | Admitting: Family Medicine

## 2018-06-15 ENCOUNTER — Encounter: Payer: Self-pay | Admitting: Family Medicine

## 2018-06-15 ENCOUNTER — Other Ambulatory Visit: Payer: Self-pay

## 2018-06-15 DIAGNOSIS — E039 Hypothyroidism, unspecified: Secondary | ICD-10-CM

## 2018-06-15 DIAGNOSIS — R369 Urethral discharge, unspecified: Secondary | ICD-10-CM

## 2018-06-15 MED ORDER — METRONIDAZOLE 500 MG PO TABS: 500.0000 mg | ORAL_TABLET | Freq: Two times a day (BID) | ORAL | 0 refills | Status: AC

## 2018-06-15 NOTE — Progress Notes (Signed)
Chief Complaint  Patient presents with  . Medication Refill    Subjective: Patient is a 38 y.o. male here for hypothyroid f/u. Due to COVID-19 pandemic, we are interacting via web portal for an electronic face-to-face visit. I verified patient's ID using 2 identifiers. Patient agreed to proceed with visit via this method. Patient is at home, I am at office. Patient and I are present for visit.   Hypothyroidism Patient presents for follow-up of hypothyroidism.  Reports compliance with medication- Synthroid 112 mcg/d. Current symptoms include: denies fatigue, weight changes, heat/cold intolerance, bowel/skin changes or CVS symptoms He believes his dose should be unchanged  STI concern Tested positive for trich in fall, feels the same s/s's of lower abd pain, some AM penile discharge. Would like to be tx'd again. Has a new partner, only been with her. No concerns from her. Denies urinary complaints, testicular pain, rashes, N/V, fevers.  ROS: Endo: As noted in HPI GU: As noted in HPI  Past Medical History:  Diagnosis Date  . History of chicken pox   . Hypothyroidism 11/16/2015    Objective: No conversational dyspnea Age appropriate judgment and insight Nml affect and mood  Assessment and Plan: Hypothyroidism, unspecified type  Penile discharge - Plan: metroNIDAZOLE (FLAGYL) 500 MG tablet  1- controlled, cont current dose. 2- new, trial Flagyl for 7 d. If no improvement, he will let us know and we will have him leave a urine sample. F/u in 6 mo for CPE or prn.  The patient voiced understanding and agreement to the plan.  Jilda Roche [place], DO 06/15/18  8:11 AM

## 2018-07-13 ENCOUNTER — Telehealth: Payer: Self-pay | Admitting: Family Medicine

## 2018-07-13 MED ORDER — LEVOTHYROXINE SODIUM 112 MCG PO TABS: ORAL_TABLET | ORAL | 3 refills | Status: DC

## 2018-07-13 NOTE — Telephone Encounter (Signed)
Copied from CRM [id number]. Topic: Quick Communication - Rx Refill/Question >> Jul 13, 2018  1:10 PM [employer], Melissa J wrote: Medication: levothyroxine (SYNTHROID) 112 MCG tablet  Has the patient contacted their pharmacy?  (Agent: If no, request that the patient contact the pharmacy for the refill.) (Agent: If yes, when and what did the pharmacy advise?)  Preferred Pharmacy (with phone number or street name): [facility]   Agent: Please be advised that RX refills may take up to 3 business days. We ask that you follow-up with your pharmacy.

## 2018-09-27 ENCOUNTER — Other Ambulatory Visit: Payer: Self-pay

## 2018-09-27 ENCOUNTER — Encounter: Payer: Self-pay | Admitting: Family Medicine

## 2018-09-27 ENCOUNTER — Ambulatory Visit (INDEPENDENT_AMBULATORY_CARE_PROVIDER_SITE_OTHER): Payer: 59 | Admitting: Family Medicine

## 2018-09-27 DIAGNOSIS — R369 Urethral discharge, unspecified: Secondary | ICD-10-CM

## 2018-09-27 MED ORDER — METRONIDAZOLE 500 MG PO TABS: 500.0000 mg | ORAL_TABLET | Freq: Two times a day (BID) | ORAL | 0 refills | Status: AC

## 2018-09-27 NOTE — Progress Notes (Signed)
No chief complaint on file.   Subjective: Patient is a 38 y.o. male here for penile drainage. Due to COVID-19 pandemic, we are interacting via web portal for an electronic face-to-face visit. I verified patient's ID using 2 identifiers. Patient agreed to proceed with visit via this method. Patient is at home, I am at office. Patient and I are present for visit.   Over past sev weeks, pt has been having penile d/c and abd pain.  This is very similar to his insulin in October 2019 where he was diagnosed with trichomonas.  He was requesting a refill of this medication.  He is faithful to one partner.  He is unsure if she is monogamous.  She denies any complaints.  The patient is not having any fevers, nausea, vomiting, testicular pain, bleeding, pain with urination.    ROS: GU: As noted in HPI  Past Medical History:  Diagnosis Date  . History of chicken pox   . Hypothyroidism 11/16/2015    Objective: No conversational dyspnea Age appropriate judgment and insight Nml affect and mood  Assessment and Plan: Penile discharge - Plan: Urine cytology ancillary only, metroNIDAZOLE (FLAGYL) 500 MG tablet; bid for 7 d. Get urine ancillary test.   Discussed PSA testing for his upcoming CPE.  I will see him as originally scheduled. The patient voiced understanding and agreement to the plan.  [place], DO 09/27/18  4:29 PM

## 2018-11-23 ENCOUNTER — Other Ambulatory Visit: Payer: Self-pay

## 2018-11-23 ENCOUNTER — Ambulatory Visit (INDEPENDENT_AMBULATORY_CARE_PROVIDER_SITE_OTHER): Payer: Self-pay | Admitting: Family Medicine

## 2018-11-23 ENCOUNTER — Encounter: Payer: Self-pay | Admitting: Family Medicine

## 2018-11-23 VITALS — BP 132/80 | HR 77 | Temp 96.0°F | Ht 78.0 in | Wt 295.2 lb

## 2018-11-23 DIAGNOSIS — Z113 Encounter for screening for infections with a predominantly sexual mode of transmission: Secondary | ICD-10-CM

## 2018-11-23 DIAGNOSIS — Z114 Encounter for screening for human immunodeficiency virus [HIV]: Secondary | ICD-10-CM

## 2018-11-23 DIAGNOSIS — R369 Urethral discharge, unspecified: Secondary | ICD-10-CM

## 2018-11-23 DIAGNOSIS — Z0000 Encounter for general adult medical examination without abnormal findings: Secondary | ICD-10-CM

## 2018-11-23 DIAGNOSIS — Z125 Encounter for screening for malignant neoplasm of prostate: Secondary | ICD-10-CM

## 2018-11-23 LAB — CBC
HCT: 47.4 % (ref 39.0–52.0)
Hemoglobin: 16 g/dL (ref 13.0–17.0)
MCHC: 33.8 g/dL (ref 30.0–36.0)
MCV: 87.3 fl (ref 78.0–100.0)
Platelets: 222 10*3/uL (ref 150.0–400.0)
RBC: 5.43 Mil/uL (ref 4.22–5.81)
RDW: 14.9 % (ref 11.5–15.5)
WBC: 6.5 10*3/uL (ref 4.0–10.5)

## 2018-11-23 LAB — COMPREHENSIVE METABOLIC PANEL
ALT: 14 U/L (ref 0–53)
AST: 17 U/L (ref 0–37)
Albumin: 4.1 g/dL (ref 3.5–5.2)
Alkaline Phosphatase: 62 U/L (ref 39–117)
BUN: 18 mg/dL (ref 6–23)
CO2: 28 mEq/L (ref 19–32)
Calcium: 9.5 mg/dL (ref 8.4–10.5)
Chloride: 105 mEq/L (ref 96–112)
Creatinine, Ser: 1.36 mg/dL (ref 0.40–1.50)
GFR: 69.98 mL/min (ref >60.00)
Glucose, Bld: 90 mg/dL (ref 70–99)
Potassium: 4.3 mEq/L (ref 3.5–5.1)
Sodium: 139 mEq/L (ref 135–145)
Total Bilirubin: 0.3 mg/dL (ref 0.2–1.2)
Total Protein: 6.5 g/dL (ref 6.0–8.3)

## 2018-11-23 LAB — LIPID PANEL
Cholesterol: 181 mg/dL (ref 0–200)
HDL: 63.5 mg/dL (ref >39.00)
LDL Cholesterol: 99 mg/dL (ref 0–99)
NonHDL: 117.26
Total CHOL/HDL Ratio: 3
Triglycerides: 92 mg/dL (ref 0.0–149.0)
VLDL: 18.4 mg/dL (ref 0.0–40.0)

## 2018-11-23 LAB — TSH: TSH: 23.84 u[IU]/mL — ABNORMAL HIGH (ref 0.35–4.50)

## 2018-11-23 LAB — T4, FREE: Free T4: 0.79 ng/dL (ref 0.60–1.60)

## 2018-11-23 LAB — PSA: PSA: 0.29 ng/mL (ref 0.10–4.00)

## 2018-11-23 MED ORDER — LEVOTHYROXINE SODIUM 112 MCG PO TABS: ORAL_TABLET | ORAL | 11 refills | Status: DC

## 2018-11-23 NOTE — Patient Instructions (Addendum)
Keep the diet clean and stay active.  Give [place] 2-3 business days to get the results of your labs back.   Let me know if you change your mind regarding the flu shot.   Let us know if you need anything.

## 2018-11-23 NOTE — Progress Notes (Signed)
Chief Complaint  Patient presents with  . Annual Exam    Well Male Greg Harrison is here for a complete physical.   His last physical was >1 year ago.  Current diet: in general, a "decent" diet.   Current exercise: walking, playing basketball Weight trend: gaining Daytime fatigue? No. Seat belt? Yes.    Health maintenance Tetanus- Yes HIV- Yes  Past Medical History:  Diagnosis Date  . History of chicken pox   . Hypothyroidism 11/16/2015     Past Surgical History:  Procedure Laterality Date  . NO PAST SURGERIES      Medications  Current Outpatient Medications on File Prior to Visit  Medication Sig Dispense Refill  . levothyroxine (SYNTHROID) 112 MCG tablet TAKE 1 TABLET BY MOUTH EVERY DAY BEFORE BREAKFAST 30 tablet 3   Allergies No Known Allergies  Family History Family History  Problem Relation Age of Onset  . Heart disease Father   . Hypertension Other     Review of Systems: Constitutional: no fevers or chills Eye:  no recent significant change in vision Ear/Nose/Mouth/Throat:  Ears:  no tinnitus or hearing loss Nose/Mouth/Throat:  no complaints of nasal congestion, no sore throat Cardiovascular:  no chest pain, no palpitations Respiratory:  no cough and no shortness of breath Gastrointestinal:  no abdominal pain, no change in bowel habits GU:  Male: negative for dysuria, frequency, and incontinence and negative for prostate symptoms Musculoskeletal/Extremities:  no pain, redness, or swelling of the joints Integumentary (Skin/Breast):  no abnormal skin lesions reported Neurologic:  no headaches, no numbness, tingling Endocrine: No unexpected weight changes Hematologic/Lymphatic:  no night sweats  Exam BP 132/80 (BP Location: Left Arm, Patient Position: Sitting, Cuff Size: Large)   Pulse 77   Temp (!) 96 F (35.6 C) (Temporal)   Ht 6\' 6"  (1.981 m)   Wt 295 lb 4 oz (133.9 kg)   SpO2 93%   BMI 34.12 kg/m  General:  well developed, well nourished, in  no apparent distress Skin:  no significant moles, warts, or growths Head:  no masses, lesions, or tenderness Eyes:  pupils equal and round, sclera anicteric without injection Ears:  canals without lesions, TMs shiny without retraction, no obvious effusion, no erythema Nose:  nares patent, septum midline, mucosa normal Throat/Pharynx:  lips and gingiva without lesion; tongue and uvula midline; non-inflamed pharynx; no exudates or postnasal drainage Neck: neck supple without adenopathy, thyromegaly, or masses Lungs:  clear to auscultation, breath sounds equal bilaterally, no respiratory distress Cardio:  regular rate and rhythm, no bruits, no LE edema Abdomen:  abdomen soft, nontender; bowel sounds normal; no masses or organomegaly Rectal: Deferred Musculoskeletal:  symmetrical muscle groups noted without atrophy or deformity Extremities:  no clubbing, cyanosis, or edema, no deformities, no skin discoloration Neuro:  gait normal; deep tendon reflexes normal and symmetric Psych: well oriented with normal range of affect and appropriate judgment/insight  Assessment and Plan  Well adult exam - Plan: CBC, Comprehensive metabolic panel, Lipid panel, TSH, T4, free  Screening for prostate cancer - Plan: PSA   Well 38 y.o. male. Counseled on diet and exercise. Counseled on risks and benefits of prostate cancer screening with PSA. He agrees to undergo testing . Other orders as above. Follow up in 1 year pending the above workup. Will combine thyroid OV w CPE as he is well controlled The patient voiced understanding and agreement to the plan.  [place], DO 11/23/18 7:30 AM

## 2018-11-25 ENCOUNTER — Telehealth: Payer: Self-pay

## 2018-11-25 NOTE — Telephone Encounter (Signed)
Copied from [place] [id number]. Topic: Appointment Scheduling - Scheduling Inquiry for Clinic >> Nov 25, 2018 10:45 AM Scherrie Gerlach wrote: Reason for CRM:  pt states every year he gets HIV and UA done. But this was not done at his 10/13 visit. Requesting these orders so he can come back in for lab visit only. You can sed message throgh mychart when orders in or call

## 2018-11-25 NOTE — Telephone Encounter (Signed)
Spoke with lab he could not have HIV done at this time I made patient aware, he stated he wanted to come in to have STD and HIV tests done, I have ordered, HIV, RPR, syphilis, Gonorrhea, chlamydia and Herpes anything else I missed  Please advise

## 2018-11-26 ENCOUNTER — Other Ambulatory Visit: Payer: Self-pay

## 2018-11-26 ENCOUNTER — Other Ambulatory Visit (HOSPITAL_COMMUNITY)
Admission: RE | Admit: 2018-11-26 | Discharge: 2018-11-26 | Disposition: A | Discharge status: Home / Self Care | Payer: Self-pay | Source: Ambulatory Visit | Attending: Family Medicine | Admitting: Family Medicine

## 2018-11-26 ENCOUNTER — Other Ambulatory Visit (INDEPENDENT_AMBULATORY_CARE_PROVIDER_SITE_OTHER): Payer: Self-pay

## 2018-11-26 DIAGNOSIS — Z113 Encounter for screening for infections with a predominantly sexual mode of transmission: Secondary | ICD-10-CM

## 2018-11-26 DIAGNOSIS — R369 Urethral discharge, unspecified: Secondary | ICD-10-CM | POA: Insufficient documentation

## 2018-11-26 DIAGNOSIS — Z114 Encounter for screening for human immunodeficiency virus [HIV]: Secondary | ICD-10-CM

## 2018-11-29 LAB — HIV ANTIBODY (ROUTINE TESTING W REFLEX): HIV 1&2 Ab, 4th Generation: NONREACTIVE

## 2018-11-29 LAB — RPR: RPR Ser Ql: NONREACTIVE

## 2018-11-30 LAB — URINE CYTOLOGY ANCILLARY ONLY
Chlamydia: NEGATIVE
Comment: NEGATIVE
Comment: NEGATIVE
Comment: NORMAL
Neisseria Gonorrhea: NEGATIVE
Trichomonas: NEGATIVE

## 2018-11-30 LAB — HSV TYPE I/II IGG, IGMW/ REFLEX
HSV 1 Glycoprotein G Ab, IgG: 40.9 index — ABNORMAL HIGH (ref 0.00–0.90)
HSV 1 IgM: <1:10 {titer}
HSV 2 IgG, Type Spec: <0.91 index (ref 0.00–0.90)
HSV 2 IgM: <1:10 {titer}

## 2019-01-14 ENCOUNTER — Encounter: Payer: Self-pay | Admitting: Family Medicine

## 2019-01-14 ENCOUNTER — Other Ambulatory Visit: Payer: Self-pay

## 2019-01-14 ENCOUNTER — Ambulatory Visit (INDEPENDENT_AMBULATORY_CARE_PROVIDER_SITE_OTHER): Payer: Self-pay | Admitting: Family Medicine

## 2019-01-14 DIAGNOSIS — Z202 Contact with and (suspected) exposure to infections with a predominantly sexual mode of transmission: Secondary | ICD-10-CM

## 2019-01-14 MED ORDER — METRONIDAZOLE 500 MG PO TABS: 500.0000 mg | ORAL_TABLET | Freq: Two times a day (BID) | ORAL | 0 refills | Status: AC

## 2019-01-14 NOTE — Progress Notes (Signed)
CC: Possible STI  Subjective: Patient is a 38 y.o. male here for possible STI. Due to COVID-19 pandemic, we are interacting via web portal for an electronic face-to-face visit. I verified patient's ID using 2 identifiers. Patient agreed to proceed with visit via this method. Patient is at home, I am at office. Patient and I are present for visit.   Over past mo, has been having tingling in penis and lower abd discomfort. No fevers, discharge, urinary complaints, injury. Faithful to his wife, not sure she is. Unfort contemplating divorce. Has hx of trich.  ROS: GU: As noted in HPI  Past Medical History:  Diagnosis Date  . History of chicken pox   . Hypothyroidism 11/16/2015    Objective: No conversational dyspnea Age appropriate judgment and insight Nml affect and mood  Assessment and Plan: Sexually transmitted disease exposure - Plan: metroNIDAZOLE (FLAGYL) 500 MG tablet  7 d, bid. If no improvement, let me know. Avoid EtOH.  F/u as originally scheduled The patient voiced understanding and agreement to the plan.  [place], DO 01/14/19  8:19 AM

## 2019-10-11 ENCOUNTER — Other Ambulatory Visit: Payer: Self-pay

## 2019-10-11 ENCOUNTER — Encounter: Payer: Self-pay | Admitting: Family Medicine

## 2019-10-11 ENCOUNTER — Ambulatory Visit (INDEPENDENT_AMBULATORY_CARE_PROVIDER_SITE_OTHER): Payer: No Typology Code available for payment source | Admitting: Family Medicine

## 2019-10-11 VITALS — BP 110/76 | HR 95 | Temp 98.7°F | Ht 78.0 in | Wt 259.4 lb

## 2019-10-11 DIAGNOSIS — R5383 Other fatigue: Secondary | ICD-10-CM

## 2019-10-11 NOTE — Progress Notes (Signed)
Chief Complaint  Patient presents with  . Fatigue    patient thinks a reaction to covid vaccine  . Generalized Body Aches    Subjective: Patient is a 39 y.o. male here for fatigue.  7/24 got Moderna vaccination. Has had fatigue, body aches, subj fevers, dehydration despite pushing fluids, sweats, poor sleep, runny nose.  Drinks around 10 bottles of water daily, this increased after his shot.   Past Medical History:  Diagnosis Date  . History of chicken pox   . Hypothyroidism 11/16/2015     Objective: BP 110/76 (BP Location: Right Arm, Patient Position: Sitting, Cuff Size: Large)   Pulse 95   Temp 98.7 F (37.1 C) (Oral)   Ht 6\' 6"  (1.981 m)   Wt 259 lb 6 oz (117.7 kg)   SpO2 97%   BMI 29.97 kg/m  General: Awake, appears stated age HEENT: MMM, EOMi Heart: RRR, no murmurs, no bruits, no LE edema Neck: Supple, symmetric, no masses or nodules noted Lungs: CTAB, no rales, wheezes or rhonchi. No accessory muscle use Psych: Age appropriate judgment and insight, normal affect and mood  Assessment and Plan: Fatigue, unspecified type - Plan: CBC, Comprehensive metabolic panel, TSH, T4, free, VITAMIN D 25 Hydroxy (Vit-D Deficiency, Fractures)  If labs are nml, OK to take 2nd vaccine. I think he might be drinking too much water, will see what Na is. Probably needs some more electrolytes. Doubt myocarditis.  The patient voiced understanding and agreement to the plan.  [place], DO 10/11/19  4:26 PM

## 2019-10-11 NOTE — Patient Instructions (Addendum)
Drink things with electrolytes (Gatorade/Powerade, etc) 10-15% of the time to replace how much water you are drinking.  OK to take Tylenol 1000 mg (2 extra strength tabs) or 975 mg (3 regular strength tabs) every 6 hours as needed.  Hold Tylenol and ibuprofen before your next shot.  Give [place] 2-3 business days to get the results of your labs back. If labs are normal, get the next shot.   Let us know if you need anything.

## 2019-10-12 ENCOUNTER — Other Ambulatory Visit: Payer: Self-pay | Admitting: Family Medicine

## 2019-10-12 DIAGNOSIS — E559 Vitamin D deficiency, unspecified: Secondary | ICD-10-CM

## 2019-10-12 LAB — COMPREHENSIVE METABOLIC PANEL
AG Ratio: 1.9 (calc) (ref 1.0–2.5)
ALT: 15 U/L (ref 9–46)
AST: 25 U/L (ref 10–40)
Albumin: 4.3 g/dL (ref 3.6–5.1)
Alkaline phosphatase (APISO): 69 U/L (ref 36–130)
BUN: 16 mg/dL (ref 7–25)
CO2: 24 mmol/L (ref 20–32)
Calcium: 9.7 mg/dL (ref 8.6–10.3)
Chloride: 104 mmol/L (ref 98–110)
Creat: 1.34 mg/dL (ref 0.60–1.35)
Globulin: 2.3 g/dL (calc) (ref 1.9–3.7)
Glucose, Bld: 85 mg/dL (ref 65–99)
Potassium: 4.5 mmol/L (ref 3.5–5.3)
Sodium: 139 mmol/L (ref 135–146)
Total Bilirubin: 0.4 mg/dL (ref 0.2–1.2)
Total Protein: 6.6 g/dL (ref 6.1–8.1)

## 2019-10-12 LAB — T4, FREE: Free T4: 1.2 ng/dL (ref 0.8–1.8)

## 2019-10-12 LAB — CBC
HCT: 45.4 % (ref 38.5–50.0)
Hemoglobin: 15.5 g/dL (ref 13.2–17.1)
MCH: 29.6 pg (ref 27.0–33.0)
MCHC: 34.1 g/dL (ref 32.0–36.0)
MCV: 86.6 fL (ref 80.0–100.0)
MPV: 10.4 fL (ref 7.5–12.5)
Platelets: 254 10*3/uL (ref 140–400)
RBC: 5.24 10*6/uL (ref 4.20–5.80)
RDW: 14 % (ref 11.0–15.0)
WBC: 8.5 10*3/uL (ref 3.8–10.8)

## 2019-10-12 LAB — VITAMIN D 25 HYDROXY (VIT D DEFICIENCY, FRACTURES): Vit D, 25-Hydroxy: 13 ng/mL — ABNORMAL LOW (ref 30–100)

## 2019-10-12 LAB — TSH: TSH: 6.38 mIU/L — ABNORMAL HIGH (ref 0.40–4.50)

## 2019-10-12 MED ORDER — VITAMIN D (ERGOCALCIFEROL) 1.25 MG (50000 UNIT) PO CAPS: 50000.0000 [IU] | ORAL_CAPSULE | ORAL | 0 refills | Status: DC

## 2019-11-25 ENCOUNTER — Other Ambulatory Visit: Payer: Self-pay

## 2019-11-25 ENCOUNTER — Encounter: Payer: Self-pay | Admitting: Family Medicine

## 2019-11-25 ENCOUNTER — Ambulatory Visit (INDEPENDENT_AMBULATORY_CARE_PROVIDER_SITE_OTHER): Payer: No Typology Code available for payment source | Admitting: Family Medicine

## 2019-11-25 VITALS — BP 130/86 | HR 64 | Temp 98.2°F | Ht 78.0 in | Wt 258.0 lb

## 2019-11-25 DIAGNOSIS — Z113 Encounter for screening for infections with a predominantly sexual mode of transmission: Secondary | ICD-10-CM | POA: Diagnosis not present

## 2019-11-25 DIAGNOSIS — Z0000 Encounter for general adult medical examination without abnormal findings: Secondary | ICD-10-CM | POA: Diagnosis not present

## 2019-11-25 DIAGNOSIS — Z1159 Encounter for screening for other viral diseases: Secondary | ICD-10-CM

## 2019-11-25 DIAGNOSIS — Z125 Encounter for screening for malignant neoplasm of prostate: Secondary | ICD-10-CM

## 2019-11-25 DIAGNOSIS — Z114 Encounter for screening for human immunodeficiency virus [HIV]: Secondary | ICD-10-CM | POA: Diagnosis not present

## 2019-11-25 MED ORDER — LEVOTHYROXINE SODIUM 112 MCG PO TABS
ORAL_TABLET | ORAL | 11 refills | Status: DC
Start: 2019-11-25 — End: 2019-11-29

## 2019-11-25 NOTE — Patient Instructions (Addendum)
Give [place] 4-5 business days to get the results of your labs back.   Keep the diet clean and stay active.  I recommend getting your second covid-19 vaccination.   Let us know if you need anything.

## 2019-11-25 NOTE — Progress Notes (Signed)
Chief Complaint  Patient presents with  . Annual Exam    Well Male Greg Harrison is here for a complete physical.   His last physical was >1 year ago.  Current diet: in general, a "healthy" diet.   Current exercise: playing basketball, lifting wts Weight trend: stable Fatigue out of ordinary? No. Seat belt? sometimes.    Health maintenance Tetanus- Yes HIV- Yes Hep C- No  Past Medical History:  Diagnosis Date  . History of chicken pox   . Hypothyroidism 11/16/2015     Past Surgical History:  Procedure Laterality Date  . NO PAST SURGERIES      Medications  Current Outpatient Medications on File Prior to Visit  Medication Sig Dispense Refill  . levothyroxine (SYNTHROID) 112 MCG tablet TAKE 1 TABLET BY MOUTH EVERY DAY BEFORE BREAKFAST 30 tablet 11  . Vitamin D, Ergocalciferol, (DRISDOL) 1.25 MG (50000 UNIT) CAPS capsule Take 1 capsule (50,000 Units total) by mouth every 7 (seven) days. 12 capsule 0    Allergies No Known Allergies  Family History Family History  Problem Relation Age of Onset  . Heart disease Father   . Hypertension Other     Review of Systems: Constitutional: no fevers or chills Eye:  no recent significant change in vision Ear/Nose/Mouth/Throat:  Ears:  no hearing loss Nose/Mouth/Throat:  no complaints of nasal congestion, no sore throat Cardiovascular:  no chest pain Respiratory:  no shortness of breath Gastrointestinal:  no abdominal pain, no change in bowel habits GU:  Male: negative for dysuria, frequency, and incontinence Musculoskeletal/Extremities:  no pain of the joints Integumentary (Skin/Breast):  no abnormal skin lesions reported Neurologic:  no headaches Endocrine: No unexpected weight changes Hematologic/Lymphatic:  no night sweats  Exam BP 130/86 (BP Location: Left Arm, Patient Position: Sitting, Cuff Size: Large)   Pulse 64   Temp 98.2 F (36.8 C) (Oral)   Ht 6\' 6"  (1.981 m)   Wt 258 lb (117 kg)   SpO2 99%   BMI 29.81  kg/m  General:  well developed, well nourished, in no apparent distress Skin:  no significant moles, warts, or growths Head:  no masses, lesions, or tenderness Eyes:  pupils equal and round, sclera anicteric without injection Ears:  canals without lesions, TMs shiny without retraction, no obvious effusion, no erythema Nose:  nares patent, septum midline, mucosa normal Throat/Pharynx:  lips and gingiva without lesion; tongue and uvula midline; non-inflamed pharynx; no exudates or postnasal drainage Neck: neck supple without adenopathy, thyromegaly, or masses Lungs:  clear to auscultation, breath sounds equal bilaterally, no respiratory distress Cardio:  regular rate and rhythm, no bruits, no LE edema Abdomen:  abdomen soft, nontender; bowel sounds normal; no masses or organomegaly Rectal: Deferred Musculoskeletal:  symmetrical muscle groups noted without atrophy or deformity Extremities:  no clubbing, cyanosis, or edema, no deformities, no skin discoloration Neuro:  gait normal; deep tendon reflexes normal and symmetric Psych: well oriented with normal range of affect and appropriate judgment/insight  Assessment and Plan  Well adult exam - Plan: Lipid panel  Screening for HIV (human immunodeficiency virus) - Plan: HIV Antibody (routine testing w rflx)  Encounter for hepatitis C screening test for low risk patient - Plan: Hepatitis C antibody  Routine screening for STI (sexually transmitted infection) - Plan: Urine cytology ancillary only([facility])  Screening for malignant neoplasm of prostate - Plan: PSA   Well 39 y.o. male. Counseled on diet and exercise. Counseled on risks and benefits of prostate cancer screening with PSA. The  patient agrees to undergo screening.  Pt feels much better w Vit D tx. He is skeptical about 2nd Moderna shot due to how he felt and low Vit D. I stated this shot is not a/w low Vit D, but even if it were, we can supplement him. He agrees to get it.   Declines flu shot today.  Other orders as above. Follow up in 6 mo pending the above workup. The patient voiced understanding and agreement to the plan.  Jilda Roche [place], DO 11/25/19 7:36 AM

## 2019-11-28 ENCOUNTER — Other Ambulatory Visit: Payer: Self-pay | Admitting: Family Medicine

## 2019-11-28 LAB — URINE CYTOLOGY ANCILLARY ONLY
Chlamydia: NEGATIVE
Comment: NEGATIVE
Comment: NEGATIVE
Comment: NORMAL
Neisseria Gonorrhea: NEGATIVE
Trichomonas: NEGATIVE

## 2019-11-28 LAB — LIPID PANEL
Cholesterol: 177 mg/dL (ref <200)
HDL: 83 mg/dL (ref >=40)
LDL Cholesterol (Calc): 73 mg/dL (calc)
Non-HDL Cholesterol (Calc): 94 mg/dL (calc) (ref <130)
Total CHOL/HDL Ratio: 2.1 (calc) (ref <5.0)
Triglycerides: 124 mg/dL (ref <150)

## 2019-11-28 LAB — HIV ANTIBODY (ROUTINE TESTING W REFLEX): HIV 1&2 Ab, 4th Generation: NONREACTIVE

## 2019-11-28 LAB — HEPATITIS C ANTIBODY
Hepatitis C Ab: NONREACTIVE
SIGNAL TO CUT-OFF: 0.01 (ref <1.00)

## 2019-11-28 LAB — PSA: PSA: 0.29 ng/mL (ref <=4.0)

## 2019-11-29 ENCOUNTER — Other Ambulatory Visit: Payer: Self-pay | Admitting: Family Medicine

## 2020-01-04 ENCOUNTER — Other Ambulatory Visit: Payer: Self-pay | Admitting: Family Medicine

## 2020-01-04 NOTE — Telephone Encounter (Signed)
Would you like Pt to continue Ergocalciferol?  

## 2020-01-05 NOTE — Telephone Encounter (Signed)
That's fine but we need to recheck as it's been around 12 weeks. Ty.

## 2020-01-09 ENCOUNTER — Other Ambulatory Visit (INDEPENDENT_AMBULATORY_CARE_PROVIDER_SITE_OTHER): Payer: No Typology Code available for payment source

## 2020-01-09 ENCOUNTER — Other Ambulatory Visit: Payer: Self-pay

## 2020-01-09 DIAGNOSIS — E559 Vitamin D deficiency, unspecified: Secondary | ICD-10-CM

## 2020-01-09 NOTE — Telephone Encounter (Signed)
Called left detailed message 

## 2020-01-09 NOTE — Telephone Encounter (Signed)
Called left message to call back 

## 2020-01-10 ENCOUNTER — Other Ambulatory Visit: Payer: Self-pay | Admitting: Family Medicine

## 2020-01-10 DIAGNOSIS — E559 Vitamin D deficiency, unspecified: Secondary | ICD-10-CM

## 2020-01-10 LAB — VITAMIN D 25 HYDROXY (VIT D DEFICIENCY, FRACTURES): Vit D, 25-Hydroxy: 28 ng/mL — ABNORMAL LOW (ref 30–100)

## 2020-01-10 MED ORDER — VITAMIN D (ERGOCALCIFEROL) 1.25 MG (50000 UNIT) PO CAPS
50000.0000 [IU] | ORAL_CAPSULE | ORAL | 0 refills | Status: DC
Start: 2020-01-10 — End: 2021-01-25

## 2020-01-11 ENCOUNTER — Encounter: Payer: Self-pay | Admitting: Family Medicine

## 2020-01-13 ENCOUNTER — Other Ambulatory Visit: Payer: No Typology Code available for payment source

## 2020-03-29 ENCOUNTER — Other Ambulatory Visit: Payer: Self-pay | Admitting: Family Medicine

## 2020-12-26 ENCOUNTER — Other Ambulatory Visit: Payer: Self-pay | Admitting: Family Medicine

## 2021-01-09 ENCOUNTER — Encounter: Payer: BC Managed Care – PPO | Admitting: Family Medicine

## 2021-01-25 ENCOUNTER — Ambulatory Visit (INDEPENDENT_AMBULATORY_CARE_PROVIDER_SITE_OTHER): Payer: BC Managed Care – PPO | Admitting: Family Medicine

## 2021-01-25 ENCOUNTER — Other Ambulatory Visit: Payer: Self-pay | Admitting: Family Medicine

## 2021-01-25 ENCOUNTER — Encounter: Payer: Self-pay | Admitting: Family Medicine

## 2021-01-25 ENCOUNTER — Telehealth: Payer: Self-pay

## 2021-01-25 VITALS — BP 128/84 | HR 87 | Temp 98.5°F | Ht 78.0 in | Wt 250.4 lb

## 2021-01-25 DIAGNOSIS — Z1159 Encounter for screening for other viral diseases: Secondary | ICD-10-CM | POA: Diagnosis not present

## 2021-01-25 DIAGNOSIS — Z114 Encounter for screening for human immunodeficiency virus [HIV]: Secondary | ICD-10-CM | POA: Diagnosis not present

## 2021-01-25 DIAGNOSIS — Z23 Encounter for immunization: Secondary | ICD-10-CM

## 2021-01-25 DIAGNOSIS — E559 Vitamin D deficiency, unspecified: Secondary | ICD-10-CM | POA: Diagnosis not present

## 2021-01-25 DIAGNOSIS — E039 Hypothyroidism, unspecified: Secondary | ICD-10-CM | POA: Diagnosis not present

## 2021-01-25 DIAGNOSIS — Z0000 Encounter for general adult medical examination without abnormal findings: Secondary | ICD-10-CM

## 2021-01-25 DIAGNOSIS — Z125 Encounter for screening for malignant neoplasm of prostate: Secondary | ICD-10-CM

## 2021-01-25 DIAGNOSIS — Z113 Encounter for screening for infections with a predominantly sexual mode of transmission: Secondary | ICD-10-CM

## 2021-01-25 LAB — LIPID PANEL
Cholesterol: 208 mg/dL — ABNORMAL HIGH (ref 0–200)
HDL: 93.3 mg/dL (ref >39.00)
LDL Cholesterol: 77 mg/dL (ref 0–99)
NonHDL: 114.9
Total CHOL/HDL Ratio: 2
Triglycerides: 189 mg/dL — ABNORMAL HIGH (ref 0.0–149.0)
VLDL: 37.8 mg/dL (ref 0.0–40.0)

## 2021-01-25 LAB — COMPREHENSIVE METABOLIC PANEL
ALT: 8 U/L (ref 0–53)
AST: 14 U/L (ref 0–37)
Albumin: 4.3 g/dL (ref 3.5–5.2)
Alkaline Phosphatase: 65 U/L (ref 39–117)
BUN: 14 mg/dL (ref 6–23)
CO2: 26 mEq/L (ref 19–32)
Calcium: 9.6 mg/dL (ref 8.4–10.5)
Chloride: 104 mEq/L (ref 96–112)
Creatinine, Ser: 1.28 mg/dL (ref 0.40–1.50)
GFR: 68.86 mL/min (ref >60.00)
Glucose, Bld: 87 mg/dL (ref 70–99)
Potassium: 4.2 mEq/L (ref 3.5–5.1)
Sodium: 139 mEq/L (ref 135–145)
Total Bilirubin: 0.4 mg/dL (ref 0.2–1.2)
Total Protein: 6.9 g/dL (ref 6.0–8.3)

## 2021-01-25 LAB — VITAMIN D 25 HYDROXY (VIT D DEFICIENCY, FRACTURES): VITD: 15.7 ng/mL — ABNORMAL LOW (ref 30.00–100.00)

## 2021-01-25 LAB — CBC
HCT: 45.2 % (ref 39.0–52.0)
Hemoglobin: 15.2 g/dL (ref 13.0–17.0)
MCHC: 33.7 g/dL (ref 30.0–36.0)
MCV: 87.8 fl (ref 78.0–100.0)
Platelets: 257 10*3/uL (ref 150.0–400.0)
RBC: 5.14 Mil/uL (ref 4.22–5.81)
RDW: 14.6 % (ref 11.5–15.5)
WBC: 7 10*3/uL (ref 4.0–10.5)

## 2021-01-25 LAB — PSA: PSA: 0.32 ng/mL (ref 0.10–4.00)

## 2021-01-25 LAB — T4, FREE: Free T4: 0.74 ng/dL (ref 0.60–1.60)

## 2021-01-25 LAB — TSH: TSH: 12.28 u[IU]/mL — ABNORMAL HIGH (ref 0.35–5.50)

## 2021-01-25 MED ORDER — VITAMIN D (ERGOCALCIFEROL) 1.25 MG (50000 UNIT) PO CAPS: 50000.0000 [IU] | ORAL_CAPSULE | ORAL | 0 refills | Status: DC

## 2021-01-25 NOTE — Addendum Note (Signed)
Addended by: Scharlene Gloss B on: 01/25/2021 10:27 AM   Modules accepted: Orders

## 2021-01-25 NOTE — Addendum Note (Signed)
Addended by: Thelma Barge D on: 01/25/2021 04:27 PM   Modules accepted: Orders

## 2021-01-25 NOTE — Telephone Encounter (Signed)
Called patient to see if he could come in to drop off a urine specimen or schedule a appointment at a later time.

## 2021-01-25 NOTE — Patient Instructions (Addendum)
Give [place] 2-3 business days to get the results of your labs back.   Keep the diet clean and stay active.  Let me know if you change your mind or have questions about the flu shot or covid vaccine.   Let us know if you need anything.

## 2021-01-25 NOTE — Progress Notes (Signed)
Chief Complaint  Patient presents with   Annual Exam    Well Male Greg Harrison is here for a complete physical.   His last physical was >1 year ago.  Current diet: in general, a "healthy" diet.   Current exercise: body weight, some cardio/playing basketball Weight trend: stable Fatigue out of ordinary? Yes, hx of low Vit D.  Seat belt? Yes.   Advanced directive? No  Health maintenance Tetanus- Yes HIV- Yes Hep C- Yes  Past Medical History:  Diagnosis Date   History of chicken pox    Hypothyroidism 11/16/2015     Past Surgical History:  Procedure Laterality Date   NO PAST SURGERIES      Medications  Current Outpatient Medications on File Prior to Visit  Medication Sig Dispense Refill   levothyroxine (SYNTHROID) 112 MCG tablet TAKE 1 TABLET BY MOUTH EVERY DAY BEFORE BREAKFAST 30 tablet 3   Vitamin D, Ergocalciferol, (DRISDOL) 1.25 MG (50000 UNIT) CAPS capsule Take 1 capsule (50,000 Units total) by mouth every 7 (seven) days. 12 capsule 0    Allergies No Known Allergies  Family History Family History  Problem Relation Age of Onset   Heart disease Father    Hypertension Other     Review of Systems: Constitutional: no fevers or chills Eye:  no recent significant change in vision Ear/Nose/Mouth/Throat:  Ears:  no hearing loss Nose/Mouth/Throat:  no complaints of nasal congestion, no sore throat Cardiovascular:  no chest pain Respiratory:  no shortness of breath Gastrointestinal:  no abdominal pain, no change in bowel habits GU:  Male: negative for dysuria, frequency, and incontinence Musculoskeletal/Extremities:  no pain of the joints Integumentary (Skin/Breast):  no abnormal skin lesions reported Neurologic:  no headaches Endocrine: No unexpected weight changes Hematologic/Lymphatic:  no night sweats  Exam BP 128/84    Pulse 87    Temp 98.5 F (36.9 C) (Oral)    Ht 6\' 6"  (1.981 m)    Wt 250 lb 6 oz (113.6 kg)    SpO2 99%    BMI 28.93 kg/m  General:  well  developed, well nourished, in no apparent distress Skin:  no significant moles, warts, or growths Head:  no masses, lesions, or tenderness Eyes:  pupils equal and round, sclera anicteric without injection Ears:  canals without lesions, TMs shiny without retraction, no obvious effusion, no erythema Nose:  nares patent, septum midline, mucosa normal Throat/Pharynx:  lips and gingiva without lesion; tongue and uvula midline; non-inflamed pharynx; no exudates or postnasal drainage Neck: neck supple without adenopathy, thyromegaly, or masses Lungs:  clear to auscultation, breath sounds equal bilaterally, no respiratory distress Cardio:  regular rate and rhythm, no bruits, no LE edema Abdomen:  abdomen soft, nontender; bowel sounds normal; no masses or organomegaly Rectal: Deferred Musculoskeletal:  symmetrical muscle groups noted without atrophy or deformity Extremities:  no clubbing, cyanosis, or edema, no deformities, no skin discoloration Neuro:  gait normal; deep tendon reflexes normal and symmetric Psych: well oriented with normal range of affect and appropriate judgment/insight  Assessment and Plan  Well adult exam - Plan: CBC, Comprehensive metabolic panel, Lipid panel  Hypothyroidism, unspecified type - Plan: TSH, T4, free  Vitamin D deficiency - Plan: VITAMIN D 25 Hydroxy (Vit-D Deficiency, Fractures)  Screening for HIV (human immunodeficiency virus) - Plan: HIV Antibody (routine testing w rflx)  Encounter for hepatitis C screening test for low risk patient - Plan: Hepatitis C antibody  Routine screening for STI (sexually transmitted infection) - Plan: Urine cytology ancillary only([facility])  Screening for malignant neoplasm of prostate - Plan: PSA   Well 40 y.o. male. Counseled on diet and exercise. Counseled on risks and benefits of prostate cancer screening with PSA. The patient agrees to undergo screening.  PCV20 today.  Declines covid booster and flu shot politely.   Other orders as above. Follow up in 6 mo pending the above workup. The patient voiced understanding and agreement to the plan.  Jilda Roche [place], DO 01/25/21 10:07 AM

## 2021-01-27 ENCOUNTER — Encounter: Payer: Self-pay | Admitting: Family Medicine

## 2021-01-28 LAB — HEPATITIS C ANTIBODY
Hepatitis C Ab: NONREACTIVE
SIGNAL TO CUT-OFF: <0.02 (ref <1.00)

## 2021-01-28 LAB — HIV ANTIBODY (ROUTINE TESTING W REFLEX): HIV 1&2 Ab, 4th Generation: NONREACTIVE

## 2021-01-29 ENCOUNTER — Other Ambulatory Visit: Payer: BC Managed Care – PPO

## 2021-01-29 ENCOUNTER — Other Ambulatory Visit: Payer: Self-pay | Admitting: Family Medicine

## 2021-01-29 ENCOUNTER — Other Ambulatory Visit (HOSPITAL_COMMUNITY)
Admission: RE | Admit: 2021-01-29 | Discharge: 2021-01-29 | Disposition: A | Discharge status: Home / Self Care | Payer: BC Managed Care – PPO | Source: Ambulatory Visit | Attending: Family Medicine | Admitting: Family Medicine

## 2021-01-29 DIAGNOSIS — Z113 Encounter for screening for infections with a predominantly sexual mode of transmission: Secondary | ICD-10-CM | POA: Diagnosis not present

## 2021-01-29 DIAGNOSIS — E559 Vitamin D deficiency, unspecified: Secondary | ICD-10-CM

## 2021-01-30 LAB — URINE CYTOLOGY ANCILLARY ONLY
Chlamydia: NEGATIVE
Comment: NEGATIVE
Comment: NEGATIVE
Comment: NORMAL
Neisseria Gonorrhea: NEGATIVE
Trichomonas: NEGATIVE

## 2021-04-22 ENCOUNTER — Other Ambulatory Visit: Payer: Self-pay | Admitting: Family Medicine

## 2021-04-29 ENCOUNTER — Ambulatory Visit: Payer: BC Managed Care – PPO | Admitting: Family Medicine

## 2021-05-07 ENCOUNTER — Other Ambulatory Visit: Payer: Self-pay | Admitting: Family Medicine

## 2021-08-22 ENCOUNTER — Other Ambulatory Visit: Payer: Self-pay | Admitting: Family Medicine

## 2021-09-21 ENCOUNTER — Other Ambulatory Visit: Payer: Self-pay | Admitting: Family Medicine

## 2021-09-27 ENCOUNTER — Ambulatory Visit: Payer: Self-pay | Admitting: Family Medicine

## 2021-10-01 ENCOUNTER — Encounter: Payer: Self-pay | Admitting: Family Medicine

## 2021-10-01 ENCOUNTER — Ambulatory Visit (INDEPENDENT_AMBULATORY_CARE_PROVIDER_SITE_OTHER): Payer: PRIVATE HEALTH INSURANCE | Admitting: Family Medicine

## 2021-10-01 ENCOUNTER — Other Ambulatory Visit (HOSPITAL_COMMUNITY)
Admission: RE | Admit: 2021-10-01 | Discharge: 2021-10-01 | Disposition: A | Discharge status: Home / Self Care | Payer: PRIVATE HEALTH INSURANCE | Source: Ambulatory Visit | Attending: Family Medicine | Admitting: Family Medicine

## 2021-10-01 VITALS — BP 122/80 | HR 78 | Temp 98.1°F | Ht 78.0 in | Wt 256.4 lb

## 2021-10-01 DIAGNOSIS — Z113 Encounter for screening for infections with a predominantly sexual mode of transmission: Secondary | ICD-10-CM

## 2021-10-01 DIAGNOSIS — Z0000 Encounter for general adult medical examination without abnormal findings: Secondary | ICD-10-CM

## 2021-10-01 DIAGNOSIS — Z125 Encounter for screening for malignant neoplasm of prostate: Secondary | ICD-10-CM

## 2021-10-01 DIAGNOSIS — E039 Hypothyroidism, unspecified: Secondary | ICD-10-CM | POA: Diagnosis not present

## 2021-10-01 DIAGNOSIS — E559 Vitamin D deficiency, unspecified: Secondary | ICD-10-CM | POA: Diagnosis not present

## 2021-10-01 DIAGNOSIS — Z1159 Encounter for screening for other viral diseases: Secondary | ICD-10-CM

## 2021-10-01 DIAGNOSIS — Z114 Encounter for screening for human immunodeficiency virus [HIV]: Secondary | ICD-10-CM | POA: Diagnosis not present

## 2021-10-01 LAB — LIPID PANEL
Cholesterol: 192 mg/dL (ref 0–200)
HDL: 88.6 mg/dL
LDL Cholesterol: 87 mg/dL (ref 0–99)
NonHDL: 103.47
Total CHOL/HDL Ratio: 2
Triglycerides: 83 mg/dL (ref 0.0–149.0)
VLDL: 16.6 mg/dL (ref 0.0–40.0)

## 2021-10-01 LAB — COMPREHENSIVE METABOLIC PANEL WITH GFR
ALT: 24 U/L (ref 0–53)
AST: 43 U/L — ABNORMAL HIGH (ref 0–37)
Albumin: 4.4 g/dL (ref 3.5–5.2)
Alkaline Phosphatase: 52 U/L (ref 39–117)
BUN: 20 mg/dL (ref 6–23)
CO2: 25 meq/L (ref 19–32)
Calcium: 9.4 mg/dL (ref 8.4–10.5)
Chloride: 101 meq/L (ref 96–112)
Creatinine, Ser: 1.39 mg/dL (ref 0.40–1.50)
GFR: 62.07 mL/min
Glucose, Bld: 90 mg/dL (ref 70–99)
Potassium: 3.9 meq/L (ref 3.5–5.1)
Sodium: 137 meq/L (ref 135–145)
Total Bilirubin: 0.5 mg/dL (ref 0.2–1.2)
Total Protein: 6.7 g/dL (ref 6.0–8.3)

## 2021-10-01 LAB — CBC
HCT: 41.9 % (ref 39.0–52.0)
Hemoglobin: 13.9 g/dL (ref 13.0–17.0)
MCHC: 33.3 g/dL (ref 30.0–36.0)
MCV: 88.5 fl (ref 78.0–100.0)
Platelets: 219 K/uL (ref 150.0–400.0)
RBC: 4.74 Mil/uL (ref 4.22–5.81)
RDW: 14.1 % (ref 11.5–15.5)
WBC: 5.3 K/uL (ref 4.0–10.5)

## 2021-10-01 LAB — PSA: PSA: 0.48 ng/mL (ref 0.10–4.00)

## 2021-10-01 LAB — TSH: TSH: 14.85 u[IU]/mL — ABNORMAL HIGH (ref 0.35–5.50)

## 2021-10-01 LAB — T4, FREE: Free T4: 0.74 ng/dL (ref 0.60–1.60)

## 2021-10-01 LAB — VITAMIN D 25 HYDROXY (VIT D DEFICIENCY, FRACTURES): VITD: 43.49 ng/mL (ref 30.00–100.00)

## 2021-10-01 NOTE — Progress Notes (Signed)
Chief Complaint  Patient presents with   Follow-up    6 month    Well Male Edinson Domeier is here for a complete physical.   His last physical was <1 year ago. New insurance.  Current diet: in general, a "healthy" diet.   Current exercise: basketball, lifting wts Weight trend: stable Fatigue out of ordinary? No. Seat belt? Yes.   Advanced directive? No  Health maintenance Tetanus- Yes HIV- Yes Hep C- Yes  Past Medical History:  Diagnosis Date   History of chicken pox    Hypothyroidism 11/16/2015     Past Surgical History:  Procedure Laterality Date   NO PAST SURGERIES      Medications  Current Outpatient Medications on File Prior to Visit  Medication Sig Dispense Refill   levothyroxine (SYNTHROID) 112 MCG tablet TAKE 1 TABLET BY MOUTH EVERY DAY BEFORE BREAKFAST 30 tablet 1   Vitamin D, Ergocalciferol, (DRISDOL) 1.25 MG (50000 UNIT) CAPS capsule TAKE 1 CAPSULE (50,000 UNITS TOTAL) BY MOUTH EVERY 7 (SEVEN) DAYS 4 capsule 2   Allergies No Known Allergies  Family History Family History  Problem Relation Age of Onset   Heart disease Father    Hypertension Other     Review of Systems: Constitutional: no fevers or chills Eye:  no recent significant change in vision Ear/Nose/Mouth/Throat:  Ears:  no hearing loss Nose/Mouth/Throat:  no complaints of nasal congestion, no sore throat Cardiovascular:  no chest pain Respiratory:  no shortness of breath Gastrointestinal:  no abdominal pain, no change in bowel habits GU:  Male: negative for dysuria, frequency, and incontinence Musculoskeletal/Extremities:  no pain of the joints Integumentary (Skin/Breast):  no abnormal skin lesions reported Neurologic:  no headaches Endocrine: No unexpected weight changes Hematologic/Lymphatic:  no night sweats  Exam BP 122/80   Pulse 78   Temp 98.1 F (36.7 C) (Oral)   Ht 6\' 6"  (1.981 m)   Wt 256 lb 6 oz (116.3 kg)   SpO2 95%   BMI 29.63 kg/m  General:  well developed, well  nourished, in no apparent distress Skin:  no significant moles, warts, or growths Head:  no masses, lesions, or tenderness Eyes:  pupils equal and round, sclera anicteric without injection Ears:  canals without lesions, TMs shiny without retraction, no obvious effusion, no erythema Nose:  nares patent, septum midline, mucosa normal Throat/Pharynx:  lips and gingiva without lesion; tongue and uvula midline; non-inflamed pharynx; no exudates or postnasal drainage Neck: neck supple without adenopathy, thyromegaly, or masses Lungs:  clear to auscultation, breath sounds equal bilaterally, no respiratory distress Cardio:  regular rate and rhythm, no bruits, no LE edema Abdomen:  abdomen soft, nontender; bowel sounds normal; no masses or organomegaly Rectal: Deferred Musculoskeletal:  symmetrical muscle groups noted without atrophy or deformity Extremities:  no clubbing, cyanosis, or edema, no deformities, no skin discoloration Neuro:  gait normal; deep tendon reflexes normal and symmetric Psych: well oriented with normal range of affect and appropriate judgment/insight  Assessment and Plan  Well adult exam - Plan: Lipid panel, Comprehensive metabolic panel, CBC, PSA  Hypothyroidism, unspecified type - Plan: TSH, T4, free  Vitamin D deficiency - Plan: VITAMIN D 25 Hydroxy (Vit-D Deficiency, Fractures)  Screening for HIV without presence of risk factors - Plan: HIV Antibody (routine testing w rflx)  Encounter for hepatitis C screening test for low risk patient - Plan: Hepatitis C antibody  Screening for malignant neoplasm of prostate - Plan: PSA  Routine screening for STI (sexually transmitted infection) - Plan:  Urine cytology ancillary only([facility])   Well 41 y.o. male. Counseled on diet and exercise. Advanced directive form provided today.  Counseled on risks and benefits of prostate cancer screening with PSA. The patient agrees to undergo screening.  Other orders as above. Follow  up in 6 mo pending the above workup. The patient voiced understanding and agreement to the plan.  Jilda Roche [place], DO 10/01/21 12:16 PM

## 2021-10-01 NOTE — Patient Instructions (Addendum)
Give us 2-3 business days to get the results of your labs back.   Keep the diet clean and stay active.  I recommend getting the flu shot in mid October. This suggestion would change if the CDC comes out with a different recommendation.   Please get me a copy of your advanced directive form at your convenience.   Let us know if you need anything.  

## 2021-10-02 ENCOUNTER — Other Ambulatory Visit: Payer: Self-pay | Admitting: Family Medicine

## 2021-10-02 DIAGNOSIS — R7989 Other specified abnormal findings of blood chemistry: Secondary | ICD-10-CM

## 2021-10-02 LAB — URINE CYTOLOGY ANCILLARY ONLY
Chlamydia: NEGATIVE
Comment: NEGATIVE
Comment: NEGATIVE
Comment: NORMAL
Neisseria Gonorrhea: NEGATIVE
Trichomonas: NEGATIVE

## 2021-10-02 LAB — HIV ANTIBODY (ROUTINE TESTING W REFLEX): HIV 1&2 Ab, 4th Generation: NONREACTIVE

## 2021-10-02 LAB — HEPATITIS C ANTIBODY: Hepatitis C Ab: NONREACTIVE

## 2021-10-16 ENCOUNTER — Other Ambulatory Visit: Payer: PRIVATE HEALTH INSURANCE

## 2021-10-28 ENCOUNTER — Other Ambulatory Visit: Payer: PRIVATE HEALTH INSURANCE

## 2021-11-25 ENCOUNTER — Other Ambulatory Visit: Payer: Self-pay | Admitting: Family Medicine

## 2021-11-27 ENCOUNTER — Other Ambulatory Visit: Payer: Self-pay | Admitting: Family Medicine

## 2021-12-10 ENCOUNTER — Other Ambulatory Visit: Payer: Self-pay | Admitting: Family Medicine

## 2022-02-12 ENCOUNTER — Other Ambulatory Visit: Payer: Self-pay | Admitting: Family Medicine

## 2022-03-05 ENCOUNTER — Encounter: Payer: PRIVATE HEALTH INSURANCE | Admitting: Family Medicine

## 2022-03-14 ENCOUNTER — Other Ambulatory Visit (HOSPITAL_COMMUNITY)
Admission: RE | Admit: 2022-03-14 | Discharge: 2022-03-14 | Disposition: A | Discharge status: Home / Self Care | Payer: Self-pay | Source: Ambulatory Visit | Attending: Family Medicine | Admitting: Family Medicine

## 2022-03-14 ENCOUNTER — Ambulatory Visit (INDEPENDENT_AMBULATORY_CARE_PROVIDER_SITE_OTHER): Payer: 59 | Admitting: Family Medicine

## 2022-03-14 ENCOUNTER — Encounter: Payer: Self-pay | Admitting: Family Medicine

## 2022-03-14 VITALS — BP 124/80 | HR 75 | Temp 98.1°F | Ht 78.0 in | Wt 276.1 lb

## 2022-03-14 DIAGNOSIS — Z125 Encounter for screening for malignant neoplasm of prostate: Secondary | ICD-10-CM | POA: Diagnosis not present

## 2022-03-14 DIAGNOSIS — Z0000 Encounter for general adult medical examination without abnormal findings: Secondary | ICD-10-CM

## 2022-03-14 DIAGNOSIS — E039 Hypothyroidism, unspecified: Secondary | ICD-10-CM

## 2022-03-14 DIAGNOSIS — Z111 Encounter for screening for respiratory tuberculosis: Secondary | ICD-10-CM

## 2022-03-14 DIAGNOSIS — E559 Vitamin D deficiency, unspecified: Secondary | ICD-10-CM | POA: Diagnosis not present

## 2022-03-14 DIAGNOSIS — Z113 Encounter for screening for infections with a predominantly sexual mode of transmission: Secondary | ICD-10-CM | POA: Diagnosis present

## 2022-03-14 DIAGNOSIS — Z114 Encounter for screening for human immunodeficiency virus [HIV]: Secondary | ICD-10-CM

## 2022-03-14 MED ORDER — LEVOTHYROXINE SODIUM 112 MCG PO TABS: ORAL_TABLET | ORAL | 11 refills | Status: DC

## 2022-03-14 NOTE — Patient Instructions (Signed)
Give us 2-3 business days to get the results of your labs back.   Keep the diet clean and stay active.  Please get me a copy of your advanced directive form at your convenience.   Let us know if you need anything.  

## 2022-03-14 NOTE — Progress Notes (Signed)
Chief Complaint  Patient presents with   Annual Exam    Well Male Greg Harrison is here for a complete physical.   His last physical was <1 year ago. New ins again.  Current diet: in general, diet is fair.   Current exercise: lifting wts, basketball, running Weight trend: up a little Fatigue out of ordinary? No. Seat belt? Yes.   Advanced directive? No  Health maintenance Tetanus- Yes HIV- Yes Hep C- Yes  Past Medical History:  Diagnosis Date   History of chicken pox    Hypothyroidism 11/16/2015     Past Surgical History:  Procedure Laterality Date   NO PAST SURGERIES      Medications  Current Outpatient Medications on File Prior to Visit  Medication Sig Dispense Refill   levothyroxine (SYNTHROID) 112 MCG tablet TAKE 1 TABLET BY MOUTH EVERY DAY BEFORE BREAKFAST 30 tablet 1   Vitamin D, Ergocalciferol, (DRISDOL) 1.25 MG (50000 UNIT) CAPS capsule TAKE 1 CAPSULE (50,000 UNITS TOTAL) BY MOUTH EVERY 7 (SEVEN) DAYS 4 capsule 2   Allergies No Known Allergies  Family History Family History  Problem Relation Age of Onset   Heart disease Father    Hypertension Other     Review of Systems: Constitutional: no fevers or chills Eye:  no recent significant change in vision Ear/Nose/Mouth/Throat:  Ears:  no hearing loss Nose/Mouth/Throat:  no complaints of nasal congestion, no sore throat Cardiovascular:  no chest pain Respiratory:  no shortness of breath Gastrointestinal:  no abdominal pain, no change in bowel habits GU:  Male: negative for dysuria, frequency, and incontinence Musculoskeletal/Extremities:  no pain of the joints Integumentary (Skin/Breast):  no abnormal skin lesions reported Neurologic:  no headaches Endocrine: No unexpected weight changes Hematologic/Lymphatic:  no night sweats  Exam There were no vitals taken for this visit. General:  well developed, well nourished, in no apparent distress Skin:  no significant moles, warts, or growths Head:  no  masses, lesions, or tenderness Eyes:  pupils equal and round, sclera anicteric without injection Ears:  canals without lesions, TMs shiny without retraction, no obvious effusion, no erythema Nose:  nares patent, mucosa normal Throat/Pharynx:  lips and gingiva without lesion; tongue and uvula midline; non-inflamed pharynx; no exudates or postnasal drainage Neck: neck supple without adenopathy, thyromegaly, or masses Lungs:  clear to auscultation, breath sounds equal bilaterally, no respiratory distress Cardio:  regular rate and rhythm, no bruits, no LE edema Abdomen:  abdomen soft, nontender; bowel sounds normal; no masses or organomegaly Rectal: Deferred Musculoskeletal:  symmetrical muscle groups noted without atrophy or deformity Extremities:  no clubbing, cyanosis, or edema, no deformities, no skin discoloration Neuro:  gait normal; deep tendon reflexes normal and symmetric Psych: well oriented with normal range of affect and appropriate judgment/insight  Assessment and Plan  Well adult exam   Well 42 y.o. male. Counseled on diet and exercise. Counseled on risks and benefits of prostate cancer screening with PSA. The patient agrees to undergo screening.  Other orders as above. Follow up in 6 mo pending the above workup. The patient voiced understanding and agreement to the plan.  [place], DO 03/14/22 1:46 PM

## 2022-03-15 ENCOUNTER — Encounter: Payer: Self-pay | Admitting: Family Medicine

## 2022-03-16 LAB — COMPREHENSIVE METABOLIC PANEL
AG Ratio: 1.7 (calc) (ref 1.0–2.5)
ALT: 22 U/L (ref 9–46)
AST: 37 U/L (ref 10–40)
Albumin: 4.3 g/dL (ref 3.6–5.1)
Alkaline phosphatase (APISO): 64 U/L (ref 36–130)
BUN/Creatinine Ratio: 10 (calc) (ref 6–22)
BUN: 14 mg/dL (ref 7–25)
CO2: 26 mmol/L (ref 20–32)
Calcium: 9.6 mg/dL (ref 8.6–10.3)
Chloride: 106 mmol/L (ref 98–110)
Creat: 1.35 mg/dL — ABNORMAL HIGH (ref 0.60–1.29)
Globulin: 2.6 g/dL (calc) (ref 1.9–3.7)
Glucose, Bld: 98 mg/dL (ref 65–99)
Potassium: 4.4 mmol/L (ref 3.5–5.3)
Sodium: 140 mmol/L (ref 135–146)
Total Bilirubin: 0.3 mg/dL (ref 0.2–1.2)
Total Protein: 6.9 g/dL (ref 6.1–8.1)

## 2022-03-16 LAB — LIPID PANEL
Cholesterol: 198 mg/dL (ref <200)
HDL: 97 mg/dL (ref >=40)
LDL Cholesterol (Calc): 87 mg/dL (calc)
Non-HDL Cholesterol (Calc): 101 mg/dL (calc) (ref <130)
Total CHOL/HDL Ratio: 2 (calc) (ref <5.0)
Triglycerides: 59 mg/dL (ref <150)

## 2022-03-16 LAB — CBC
HCT: 42.2 % (ref 38.5–50.0)
Hemoglobin: 14.1 g/dL (ref 13.2–17.1)
MCH: 28.5 pg (ref 27.0–33.0)
MCHC: 33.4 g/dL (ref 32.0–36.0)
MCV: 85.3 fL (ref 80.0–100.0)
MPV: 10.3 fL (ref 7.5–12.5)
Platelets: 242 10*3/uL (ref 140–400)
RBC: 4.95 10*6/uL (ref 4.20–5.80)
RDW: 14.4 % (ref 11.0–15.0)
WBC: 6.2 10*3/uL (ref 3.8–10.8)

## 2022-03-16 LAB — T4, FREE: Free T4: 0.9 ng/dL (ref 0.8–1.8)

## 2022-03-16 LAB — QUANTIFERON-TB GOLD PLUS
Mitogen-NIL: >10 IU/mL
NIL: 0.01 IU/mL
QuantiFERON-TB Gold Plus: NEGATIVE
TB1-NIL: 0 IU/mL
TB2-NIL: 0 IU/mL

## 2022-03-16 LAB — VITAMIN D 25 HYDROXY (VIT D DEFICIENCY, FRACTURES): Vit D, 25-Hydroxy: 49 ng/mL (ref 30–100)

## 2022-03-16 LAB — HIV ANTIBODY (ROUTINE TESTING W REFLEX): HIV 1&2 Ab, 4th Generation: NONREACTIVE

## 2022-03-16 LAB — PSA: PSA: 0.38 ng/mL (ref <=4.00)

## 2022-03-16 LAB — TSH: TSH: 12.25 mIU/L — ABNORMAL HIGH (ref 0.40–4.50)

## 2022-03-17 ENCOUNTER — Telehealth: Payer: Self-pay | Admitting: Family Medicine

## 2022-03-17 NOTE — Telephone Encounter (Signed)
Patient requesting form to be emailed to him at [email or web address]

## 2022-03-17 NOTE — Telephone Encounter (Signed)
Will do!

## 2022-03-21 LAB — URINE CYTOLOGY ANCILLARY ONLY
Chlamydia: NEGATIVE
Comment: NEGATIVE
Comment: NEGATIVE
Comment: NORMAL
Neisseria Gonorrhea: NEGATIVE
Trichomonas: NEGATIVE

## 2022-03-24 ENCOUNTER — Encounter: Payer: PRIVATE HEALTH INSURANCE | Admitting: Family Medicine

## 2023-01-06 ENCOUNTER — Encounter: Payer: Self-pay | Admitting: Family Medicine

## 2023-01-21 ENCOUNTER — Encounter: Payer: Self-pay | Admitting: Family Medicine

## 2023-02-03 ENCOUNTER — Ambulatory Visit (INDEPENDENT_AMBULATORY_CARE_PROVIDER_SITE_OTHER): Payer: Self-pay | Admitting: Family Medicine

## 2023-02-03 ENCOUNTER — Other Ambulatory Visit (HOSPITAL_COMMUNITY)
Admission: RE | Admit: 2023-02-03 | Discharge: 2023-02-03 | Disposition: A | Discharge status: Home / Self Care | Payer: Self-pay | Source: Ambulatory Visit | Attending: Family Medicine | Admitting: Family Medicine

## 2023-02-03 ENCOUNTER — Other Ambulatory Visit: Payer: Self-pay | Admitting: Family Medicine

## 2023-02-03 ENCOUNTER — Encounter: Payer: Self-pay | Admitting: Family Medicine

## 2023-02-03 VITALS — BP 132/82 | HR 68 | Temp 98.0°F | Resp 16 | Ht 78.0 in | Wt 261.4 lb

## 2023-02-03 DIAGNOSIS — Z125 Encounter for screening for malignant neoplasm of prostate: Secondary | ICD-10-CM

## 2023-02-03 DIAGNOSIS — Z1211 Encounter for screening for malignant neoplasm of colon: Secondary | ICD-10-CM

## 2023-02-03 DIAGNOSIS — Z114 Encounter for screening for human immunodeficiency virus [HIV]: Secondary | ICD-10-CM

## 2023-02-03 DIAGNOSIS — Z113 Encounter for screening for infections with a predominantly sexual mode of transmission: Secondary | ICD-10-CM | POA: Insufficient documentation

## 2023-02-03 DIAGNOSIS — Z23 Encounter for immunization: Secondary | ICD-10-CM

## 2023-02-03 DIAGNOSIS — E039 Hypothyroidism, unspecified: Secondary | ICD-10-CM

## 2023-02-03 DIAGNOSIS — E559 Vitamin D deficiency, unspecified: Secondary | ICD-10-CM

## 2023-02-03 DIAGNOSIS — Z0000 Encounter for general adult medical examination without abnormal findings: Secondary | ICD-10-CM

## 2023-02-03 LAB — LIPID PANEL
Cholesterol: 192 mg/dL (ref 0–200)
HDL: 106.4 mg/dL (ref >39.00)
LDL Cholesterol: 63 mg/dL (ref 0–99)
NonHDL: 85.33
Total CHOL/HDL Ratio: 2
Triglycerides: 112 mg/dL (ref 0.0–149.0)
VLDL: 22.4 mg/dL (ref 0.0–40.0)

## 2023-02-03 LAB — COMPREHENSIVE METABOLIC PANEL
ALT: 24 U/L (ref 0–53)
AST: 30 U/L (ref 0–37)
Albumin: 4 g/dL (ref 3.5–5.2)
Alkaline Phosphatase: 78 U/L (ref 39–117)
BUN: 12 mg/dL (ref 6–23)
CO2: 28 meq/L (ref 19–32)
Calcium: 9.4 mg/dL (ref 8.4–10.5)
Chloride: 103 meq/L (ref 96–112)
Creatinine, Ser: 1.05 mg/dL (ref 0.40–1.50)
GFR: 86.1 mL/min (ref >60.00)
Glucose, Bld: 82 mg/dL (ref 70–99)
Potassium: 4 meq/L (ref 3.5–5.1)
Sodium: 139 meq/L (ref 135–145)
Total Bilirubin: 0.2 mg/dL (ref 0.2–1.2)
Total Protein: 6.5 g/dL (ref 6.0–8.3)

## 2023-02-03 LAB — TSH: TSH: 8.27 u[IU]/mL — ABNORMAL HIGH (ref 0.35–5.50)

## 2023-02-03 LAB — CBC
HCT: 43.5 % (ref 39.0–52.0)
Hemoglobin: 14.2 g/dL (ref 13.0–17.0)
MCHC: 32.7 g/dL (ref 30.0–36.0)
MCV: 88.8 fL (ref 78.0–100.0)
Platelets: 320 10*3/uL (ref 150.0–400.0)
RBC: 4.9 Mil/uL (ref 4.22–5.81)
RDW: 14 % (ref 11.5–15.5)
WBC: 6.5 10*3/uL (ref 4.0–10.5)

## 2023-02-03 LAB — T4, FREE: Free T4: 0.65 ng/dL (ref 0.60–1.60)

## 2023-02-03 LAB — PSA: PSA: 0.43 ng/mL (ref 0.10–4.00)

## 2023-02-03 LAB — VITAMIN D 25 HYDROXY (VIT D DEFICIENCY, FRACTURES): VITD: 18.12 ng/mL — ABNORMAL LOW (ref 30.00–100.00)

## 2023-02-03 MED ORDER — VITAMIN D (ERGOCALCIFEROL) 1.25 MG (50000 UNIT) PO CAPS: 50000.0000 [IU] | ORAL_CAPSULE | ORAL | 0 refills | Status: DC

## 2023-02-03 NOTE — Progress Notes (Signed)
Chief Complaint  Patient presents with   Annual Exam    Annual Exam    Well Male Greg Harrison is here for a complete physical.   His last physical was earlier in the year but he got new insurance. Current diet: in general, a "healthy" diet.   Current exercise: basketball, wt lifting Weight trend: stable Fatigue out of ordinary? No. Seat belt? Yes.   Advanced directive? Yes  Health maintenance Tetanus- Yes HIV- Yes Hep C- Yes  Past Medical History:  Diagnosis Date   History of chicken pox    Hypothyroidism 11/16/2015     Past Surgical History:  Procedure Laterality Date   NO PAST SURGERIES      Medications  Current Outpatient Medications on File Prior to Visit  Medication Sig Dispense Refill   levothyroxine (SYNTHROID) 112 MCG tablet Take 1 tab daily. 30 tablet 11   No current facility-administered medications on file prior to visit.     Allergies No Known Allergies  Family History Family History  Problem Relation Age of Onset   Heart disease Father    Hypertension Other     Review of Systems: Constitutional: no fevers or chills Eye:  no recent significant change in vision Ear/Nose/Mouth/Throat:  Ears:  no hearing loss Nose/Mouth/Throat:  no complaints of nasal congestion, no sore throat Cardiovascular:  no chest pain Respiratory:  no shortness of breath Gastrointestinal:  no abdominal pain, no change in bowel habits GU:  Male: negative for dysuria, frequency, and incontinence Musculoskeletal/Extremities:  no pain of the joints Integumentary (Skin/Breast):  no abnormal skin lesions reported Neurologic:  no headaches Endocrine: No unexpected weight changes Hematologic/Lymphatic:  no night sweats  Exam BP 132/82   Pulse 68   Temp 98 F (36.7 C) (Oral)   Resp 16   Ht 6\' 6"  (1.981 m)   Wt 261 lb 6.4 oz (118.6 kg)   SpO2 98%   BMI 30.21 kg/m  General:  well developed, well nourished, in no apparent distress Skin:  no significant moles, warts, or  growths Head:  no masses, lesions, or tenderness Eyes:  pupils equal and round, sclera anicteric without injection Ears:  canals without lesions, TMs shiny without retraction, no obvious effusion, no erythema Nose:  nares patent, mucosa normal Throat/Pharynx:  lips and gingiva without lesion; tongue and uvula midline; non-inflamed pharynx; no exudates or postnasal drainage Neck: neck supple without adenopathy, thyromegaly, or masses Lungs:  clear to auscultation, breath sounds equal bilaterally, no respiratory distress Cardio:  regular rate and rhythm, no bruits, no LE edema Abdomen:  abdomen soft, nontender; bowel sounds normal; no masses or organomegaly Rectal: Deferred Musculoskeletal:  symmetrical muscle groups noted without atrophy or deformity Extremities:  no clubbing, cyanosis, or edema, no deformities, no skin discoloration Neuro:  gait normal; deep tendon reflexes normal and symmetric Psych: well oriented with normal range of affect and appropriate judgment/insight  Assessment and Plan  Well adult exam - Plan: CBC, Comprehensive metabolic panel, Lipid panel  Hypothyroidism, unspecified type - Plan: TSH, T4, free  Screening for HIV without presence of risk factors - Plan: HIV Antibody (routine testing w rflx)  Routine screening for STI (sexually transmitted infection) - Plan: Urine cytology ancillary only([facility])  Vitamin D deficiency - Plan: VITAMIN D 25 Hydroxy (Vit-D Deficiency, Fractures)  Screening for prostate cancer - Plan: PSA   Well 42 y.o. male. Counseled on diet and exercise. Advanced directive form requested today.  Counseled on risks and benefits of prostate cancer screening with  PSA. The patient agrees to undergo screening.  He turns 43 in less than 6 weeks, we will place referral to the GI team today. Other orders as above. Follow up in 6 mo pending the above workup. The patient voiced understanding and agreement to the plan.  Jilda Roche  [place], DO 02/03/23 8:37 AM

## 2023-02-03 NOTE — Patient Instructions (Signed)
Give us 2-3 business days to get the results of your labs back.   Keep the diet clean and stay active.  Please get me a copy of your advanced directive form at your convenience.   If you do not hear anything about your referral in the next 1-2 weeks, call our office and ask for an update.  Let us know if you need anything. 

## 2023-02-04 LAB — HIV ANTIBODY (ROUTINE TESTING W REFLEX): HIV 1&2 Ab, 4th Generation: NONREACTIVE

## 2023-02-05 ENCOUNTER — Other Ambulatory Visit: Payer: Self-pay | Admitting: *Deleted

## 2023-02-05 DIAGNOSIS — E559 Vitamin D deficiency, unspecified: Secondary | ICD-10-CM

## 2023-02-05 LAB — URINE CYTOLOGY ANCILLARY ONLY
Chlamydia: NEGATIVE
Comment: NEGATIVE
Comment: NEGATIVE
Comment: NORMAL
Neisseria Gonorrhea: NEGATIVE
Trichomonas: NEGATIVE

## 2023-05-04 ENCOUNTER — Other Ambulatory Visit: Payer: Self-pay | Admitting: Family Medicine

## 2023-07-31 ENCOUNTER — Other Ambulatory Visit: Payer: Self-pay | Admitting: Family Medicine

## 2023-08-10 ENCOUNTER — Ambulatory Visit: Payer: Self-pay | Admitting: Family Medicine

## 2023-08-17 ENCOUNTER — Ambulatory Visit: Payer: Self-pay

## 2023-08-17 ENCOUNTER — Encounter: Payer: Self-pay | Admitting: Family Medicine

## 2023-08-17 ENCOUNTER — Ambulatory Visit (INDEPENDENT_AMBULATORY_CARE_PROVIDER_SITE_OTHER): Payer: Self-pay | Admitting: Family Medicine

## 2023-08-17 ENCOUNTER — Other Ambulatory Visit (HOSPITAL_COMMUNITY)
Admission: RE | Admit: 2023-08-17 | Discharge: 2023-08-17 | Disposition: A | Discharge status: Home / Self Care | Payer: Self-pay | Source: Ambulatory Visit | Attending: Family Medicine | Admitting: Family Medicine

## 2023-08-17 VITALS — BP 130/72 | HR 100 | Temp 98.0°F | Resp 16 | Ht 78.0 in | Wt 262.0 lb

## 2023-08-17 DIAGNOSIS — R0683 Snoring: Secondary | ICD-10-CM

## 2023-08-17 DIAGNOSIS — Z114 Encounter for screening for human immunodeficiency virus [HIV]: Secondary | ICD-10-CM

## 2023-08-17 DIAGNOSIS — Z113 Encounter for screening for infections with a predominantly sexual mode of transmission: Secondary | ICD-10-CM

## 2023-08-17 DIAGNOSIS — R5383 Other fatigue: Secondary | ICD-10-CM

## 2023-08-17 NOTE — Progress Notes (Signed)
 Chief Complaint  Patient presents with   Fatigue    Fatigue    Subjective: Patient is a 43 y.o. male here for fatigue.  Going on for 3 weeks. No obvious cause. Mood is stable. Diet is decent. He is playing basketball and lifting weights. Gets around 5.5 hrs of sleep nightly. Even when he gets 7-8 hrs of sleep, he does not feel well rested. Never had a sleep study. He does snore. No N/V/D, SOB, CP.   Past Medical History:  Diagnosis Date   History of chicken pox    Hypothyroidism 11/16/2015    Objective: BP 130/72 (BP Location: Left Arm, Patient Position: Sitting)   Pulse 100   Temp 98 F (36.7 C) (Oral)   Resp 16   Ht 6' 6 (1.981 m)   Wt 262 lb (118.8 kg)   SpO2 98%   BMI 30.28 kg/m  General: Awake, appears stated age Heart: RRR, no LE edema Mouth: MMM Lungs: CTAB, no rales, wheezes or rhonchi. No accessory muscle use Psych: Age appropriate judgment and insight, normal affect and mood  Assessment and Plan: Snoring - Plan: Ambulatory referral to Neurology  Screening for HIV (human immunodeficiency virus) - Plan: HIV Antibody (routine testing w rflx)  Screening examination for STI - Plan: Hepatitis B surface antibody,quantitative, Urine cytology ancillary only  Fatigue, unspecified type - Plan: Ambulatory referral to Neurology, CBC, Comprehensive metabolic panel with GFR, TSH, VITAMIN D  25 Hydroxy (Vit-D Deficiency, Fractures)  Suspect sleep disorder (OSA) + lack of sleep. Refer neuro for OSA eval. Counseled on diet/exercise. R/o or in metabolic causes w labs. Requested STI screening. OK.  F/u as originally scheduled.  The patient voiced understanding and agreement to the plan.  Mabel Mt [place], DO 08/17/23  3:21 PM

## 2023-08-17 NOTE — Patient Instructions (Signed)
 Give us  2-3 business days to get the results of your labs back.   Stay hydrated.  Keep the diet clean and stay active.  If you do not hear anything about your referral in the next 1-2 weeks, call our office and ask for an update.  Try to get 7-9 hrs of sleep nightly.   Let us  know if you need anything.

## 2023-08-17 NOTE — Telephone Encounter (Signed)
  FYI Only or Action Required?: FYI only for provider.  Patient was last seen in primary care on 02/03/2023 by Frann Mabel Mt, DO. Called Nurse Triage reporting Fatigue. Symptoms began several weeks ago. Interventions attempted: OTC medications: Asprin. Symptoms are: unchanged.  Triage Disposition: See PCP When Office is Open (Within 3 Days)  Patient/caregiver understands and will follow disposition?: Yes                             Copied from CRM [id number]. Topic: Clinical - Red Word Triage >> Aug 17, 2023 11:15 AM Suzen RAMAN wrote: Red Word that prompted transfer to Nurse Triage: Extreme Fatigue   ----------------------------------------------------------------------- From previous Reason for Contact - Scheduling: Patient/patient representative is calling to schedule an appointment. Refer to attachments for appointment information. Reason for Disposition  [1] Fatigue (i.e., tires easily, decreased energy) AND [2] persists > 1 week  Answer Assessment - Initial Assessment Questions 1. DESCRIPTION: Describe how you are feeling.     Super tire 2. SEVERITY: How bad is it?  Can you stand and walk?   - MILD (0-3): Feels weak or tired, but does not interfere with work, school or normal activities.   - MODERATE (4-7): Able to stand and walk; weakness interferes with work, school, or normal activities.   - SEVERE (8-10): Unable to stand or walk; unable to do usual activities.     States he can still walk normally, denies fainting/falls 3. ONSET: When did these symptoms begin? (e.g., hours, days, weeks, months)     2-3 weeks ago 4. CAUSE: What do you think is causing the weakness or fatigue? (e.g., not drinking enough fluids, medical problem, trouble sleeping)     Unsure 5. NEW MEDICINES:  Have you started on any new medicines recently? (e.g., opioid pain medicines, benzodiazepines, muscle relaxants, antidepressants, antihistamines, neuroleptics,  beta blockers)     Denies 6. OTHER SYMPTOMS: Do you have any other symptoms? (e.g., chest pain, fever, cough, SOB, vomiting, diarrhea, bleeding, other areas of pain)     Mild headache, fogginess in eyes, denies body aches, denies fever, denies weakness BP is 149/99 while on phone with this RN    Requesting STD testing- denies symptoms at this time  Protocols used: Weakness (Generalized) and Fatigue-A-AH

## 2023-08-18 ENCOUNTER — Ambulatory Visit: Payer: Self-pay | Admitting: Family Medicine

## 2023-08-18 DIAGNOSIS — R739 Hyperglycemia, unspecified: Secondary | ICD-10-CM

## 2023-08-18 DIAGNOSIS — E039 Hypothyroidism, unspecified: Secondary | ICD-10-CM

## 2023-08-18 LAB — CBC
HCT: 44.1 % (ref 39.0–52.0)
Hemoglobin: 14.7 g/dL (ref 13.0–17.0)
MCHC: 33.4 g/dL (ref 30.0–36.0)
MCV: 86.5 fl (ref 78.0–100.0)
Platelets: 241 K/uL (ref 150.0–400.0)
RBC: 5.1 Mil/uL (ref 4.22–5.81)
RDW: 14.6 % (ref 11.5–15.5)
WBC: 8.6 K/uL (ref 4.0–10.5)

## 2023-08-18 LAB — COMPREHENSIVE METABOLIC PANEL WITH GFR
ALT: 19 U/L (ref 0–53)
AST: 24 U/L (ref 0–37)
Albumin: 4.2 g/dL (ref 3.5–5.2)
Alkaline Phosphatase: 63 U/L (ref 39–117)
BUN: 11 mg/dL (ref 6–23)
CO2: 27 meq/L (ref 19–32)
Calcium: 9.6 mg/dL (ref 8.4–10.5)
Chloride: 105 meq/L (ref 96–112)
Creatinine, Ser: 1.26 mg/dL (ref 0.40–1.50)
GFR: 68.92 mL/min (ref >60.00)
Glucose, Bld: 131 mg/dL — ABNORMAL HIGH (ref 70–99)
Potassium: 3.7 meq/L (ref 3.5–5.1)
Sodium: 139 meq/L (ref 135–145)
Total Bilirubin: 0.4 mg/dL (ref 0.2–1.2)
Total Protein: 6.6 g/dL (ref 6.0–8.3)

## 2023-08-18 LAB — TSH: TSH: 8.66 u[IU]/mL — ABNORMAL HIGH (ref 0.35–5.50)

## 2023-08-18 LAB — HIV ANTIBODY (ROUTINE TESTING W REFLEX): HIV 1&2 Ab, 4th Generation: NONREACTIVE

## 2023-08-18 LAB — VITAMIN D 25 HYDROXY (VIT D DEFICIENCY, FRACTURES): VITD: 19.72 ng/mL — ABNORMAL LOW (ref 30.00–100.00)

## 2023-08-18 LAB — HEPATITIS B SURFACE ANTIBODY, QUANTITATIVE: Hep B S AB Quant (Post): <5 m[IU]/mL — ABNORMAL LOW (ref >=10)

## 2023-08-18 MED ORDER — VITAMIN D (ERGOCALCIFEROL) 1.25 MG (50000 UNIT) PO CAPS: 50000.0000 [IU] | ORAL_CAPSULE | ORAL | 1 refills | Status: DC

## 2023-08-19 ENCOUNTER — Ambulatory Visit: Payer: Self-pay | Admitting: Family Medicine

## 2023-08-19 ENCOUNTER — Other Ambulatory Visit (INDEPENDENT_AMBULATORY_CARE_PROVIDER_SITE_OTHER): Payer: Self-pay

## 2023-08-19 DIAGNOSIS — R739 Hyperglycemia, unspecified: Secondary | ICD-10-CM

## 2023-08-19 DIAGNOSIS — E039 Hypothyroidism, unspecified: Secondary | ICD-10-CM

## 2023-08-19 LAB — T4, FREE: Free T4: 0.65 ng/dL (ref 0.60–1.60)

## 2023-08-19 LAB — HEMOGLOBIN A1C: Hgb A1c MFr Bld: 6.5 % (ref 4.6–6.5)

## 2023-08-19 LAB — URINE CYTOLOGY ANCILLARY ONLY
Chlamydia: NEGATIVE
Comment: NEGATIVE
Comment: NEGATIVE
Comment: NORMAL
Neisseria Gonorrhea: NEGATIVE
Trichomonas: NEGATIVE

## 2023-08-29 ENCOUNTER — Other Ambulatory Visit: Payer: Self-pay | Admitting: Family Medicine

## 2023-11-16 ENCOUNTER — Telehealth: Payer: Self-pay

## 2023-11-16 NOTE — Telephone Encounter (Signed)
 Copied from CRM [id number]. Topic: Clinical - Medication Question >> Nov 16, 2023 12:20 PM Harlene ORN wrote: Reason for CRM:  Patient called requesting information on who he would need to speak to concerning a medical certificate for employment. Please call back the patient to advise.   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 Pt was advised his due with us  for CPE as well. Pt stated he will call back later.

## 2023-12-03 ENCOUNTER — Other Ambulatory Visit (HOSPITAL_COMMUNITY)
Admission: RE | Admit: 2023-12-03 | Discharge: 2023-12-03 | Disposition: A | Discharge status: Home / Self Care | Payer: Self-pay | Source: Ambulatory Visit | Attending: Family Medicine | Admitting: Family Medicine

## 2023-12-03 ENCOUNTER — Encounter: Payer: Self-pay | Admitting: Family Medicine

## 2023-12-03 ENCOUNTER — Ambulatory Visit: Payer: Self-pay | Admitting: Family Medicine

## 2023-12-03 VITALS — BP 144/80 | HR 80 | Resp 18 | Ht 78.0 in | Wt 271.0 lb

## 2023-12-03 DIAGNOSIS — Z1159 Encounter for screening for other viral diseases: Secondary | ICD-10-CM

## 2023-12-03 DIAGNOSIS — Z114 Encounter for screening for human immunodeficiency virus [HIV]: Secondary | ICD-10-CM

## 2023-12-03 DIAGNOSIS — Z23 Encounter for immunization: Secondary | ICD-10-CM

## 2023-12-03 DIAGNOSIS — Z113 Encounter for screening for infections with a predominantly sexual mode of transmission: Secondary | ICD-10-CM

## 2023-12-03 DIAGNOSIS — R739 Hyperglycemia, unspecified: Secondary | ICD-10-CM

## 2023-12-03 DIAGNOSIS — E039 Hypothyroidism, unspecified: Secondary | ICD-10-CM

## 2023-12-03 DIAGNOSIS — E559 Vitamin D deficiency, unspecified: Secondary | ICD-10-CM

## 2023-12-03 NOTE — Progress Notes (Signed)
 Chief Complaint  Patient presents with   Follow-up    Subjective: Patient is a 43 y.o. male here for f/u.  Hypothyroidism Patient presents for follow-up of hypothyroidism.  Reports compliance with medication-levothyroxine  112 mcgd. Current symptoms include: denies fatigue, weight changes, heat/cold intolerance, bowel/skin changes or CVS symptoms He believes his dose should be not significantly changed  He has a history of vitamin D  deficiency.  He is currently taking 50,000 units every 7 days.  Compliant, no adverse effects.  He does not take a daily supplement.  Has not noticed a huge difference since starting this supplement.  A1c was 6.5 at his last visit.  Diet has been healthy.  He is exercising routinely with basketball and strength training.  Past Medical History:  Diagnosis Date   History of chicken pox    Hypothyroidism 11/16/2015    Objective: BP (!) 144/80   Pulse 80   Resp 18   Ht 6' 6 (1.981 m)   Wt 271 lb (122.9 kg)   SpO2 98%   BMI 31.32 kg/m  General: Awake, appears stated age Heart: RRR, no LE edema Lungs: CTAB, no rales, wheezes or rhonchi. No accessory muscle use Psych: Age appropriate judgment and insight, normal affect and mood  Assessment and Plan: Hypothyroidism, unspecified type - Plan: T4, free, TSH  Vitamin D  deficiency - Plan: VITAMIN D  25 Hydroxy (Vit-D Deficiency, Fractures)  Hyperglycemia - Plan: CBC, Comprehensive metabolic panel with GFR, Hemoglobin A1c, Lipid panel  Screening examination for STI - Plan: Urine cytology ancillary only  Encounter for hepatitis C screening test for low risk patient - Plan: Hepatitis C antibody  Screening for HIV without presence of risk factors - Plan: HIV Antibody (routine testing w rflx)  Need for hepatitis B screening test - Plan: Hepatitis B surface antibody,quantitative  Need for influenza vaccination - Plan: Flu vaccine trivalent PF, 6mos and older(Flulaval,Afluria,Fluarix,Fluzone)  Chronic,  stable.  Continue levothyroxine  112 mcg daily.  Check labs. Chronic, hopefully stable.  Check labs.  Continue vitamin D  supplementation 50,000 units weekly. Check labs.  If he is 6.5 or higher with his A1c, he will officially have a diabetes diagnosis.  Will have to start a statin and discuss optimal management for this. Screen STIs at his request. Screen as above. Screen as above. Screen as above. Flu shot today. Monitor blood pressure at home.  Weight loss will help with sugar and blood pressure. The patient voiced understanding and agreement to the plan.  Mabel Mt [place], DO 12/03/23  2:38 PM

## 2023-12-04 ENCOUNTER — Ambulatory Visit: Payer: Self-pay | Admitting: Family Medicine

## 2023-12-04 LAB — HEPATITIS C ANTIBODY: Hepatitis C Ab: NONREACTIVE

## 2023-12-04 LAB — CBC
HCT: 45.3 % (ref 39.0–52.0)
Hemoglobin: 14.8 g/dL (ref 13.0–17.0)
MCHC: 32.6 g/dL (ref 30.0–36.0)
MCV: 89 fl (ref 78.0–100.0)
Platelets: 236 K/uL (ref 150.0–400.0)
RBC: 5.09 Mil/uL (ref 4.22–5.81)
RDW: 15.2 % (ref 11.5–15.5)
WBC: 5.7 K/uL (ref 4.0–10.5)

## 2023-12-04 LAB — COMPREHENSIVE METABOLIC PANEL WITH GFR
ALT: 20 U/L (ref 0–53)
AST: 25 U/L (ref 0–37)
Albumin: 4.3 g/dL (ref 3.5–5.2)
Alkaline Phosphatase: 64 U/L (ref 39–117)
BUN: 13 mg/dL (ref 6–23)
CO2: 26 meq/L (ref 19–32)
Calcium: 9.4 mg/dL (ref 8.4–10.5)
Chloride: 102 meq/L (ref 96–112)
Creatinine, Ser: 1.13 mg/dL (ref 0.40–1.50)
GFR: 78.38 mL/min (ref >60.00)
Glucose, Bld: 96 mg/dL (ref 70–99)
Potassium: 4.2 meq/L (ref 3.5–5.1)
Sodium: 139 meq/L (ref 135–145)
Total Bilirubin: 0.5 mg/dL (ref 0.2–1.2)
Total Protein: 6.9 g/dL (ref 6.0–8.3)

## 2023-12-04 LAB — TSH: TSH: 17.56 u[IU]/mL — ABNORMAL HIGH (ref 0.35–5.50)

## 2023-12-04 LAB — T4, FREE: Free T4: 0.65 ng/dL (ref 0.60–1.60)

## 2023-12-04 LAB — LIPID PANEL
Cholesterol: 222 mg/dL — ABNORMAL HIGH (ref 0–200)
HDL: 100.6 mg/dL (ref >39.00)
LDL Cholesterol: 104 mg/dL — ABNORMAL HIGH (ref 0–99)
NonHDL: 121.49
Total CHOL/HDL Ratio: 2
Triglycerides: 88 mg/dL (ref 0.0–149.0)
VLDL: 17.6 mg/dL (ref 0.0–40.0)

## 2023-12-04 LAB — HEMOGLOBIN A1C: Hgb A1c MFr Bld: 6.3 % (ref 4.6–6.5)

## 2023-12-04 LAB — VITAMIN D 25 HYDROXY (VIT D DEFICIENCY, FRACTURES): VITD: 24.36 ng/mL — ABNORMAL LOW (ref 30.00–100.00)

## 2023-12-04 LAB — HEPATITIS B SURFACE ANTIBODY, QUANTITATIVE: Hep B S AB Quant (Post): <5 m[IU]/mL — ABNORMAL LOW (ref >=10)

## 2023-12-04 LAB — HIV ANTIBODY (ROUTINE TESTING W REFLEX)
HIV 1&2 Ab, 4th Generation: NONREACTIVE
HIV FINAL INTERPRETATION: NEGATIVE

## 2023-12-07 LAB — URINE CYTOLOGY ANCILLARY ONLY
Chlamydia: NEGATIVE
Comment: NEGATIVE
Comment: NEGATIVE
Comment: NORMAL
Neisseria Gonorrhea: NEGATIVE
Trichomonas: NEGATIVE

## 2024-01-29 ENCOUNTER — Other Ambulatory Visit: Payer: Self-pay | Admitting: Family Medicine

## 2024-02-20 ENCOUNTER — Other Ambulatory Visit: Payer: Self-pay | Admitting: Family Medicine

## 2024-03-15 ENCOUNTER — Encounter: Payer: Self-pay | Admitting: Family Medicine

## 2024-05-31 ENCOUNTER — Encounter: Payer: Self-pay | Admitting: Family Medicine
